# Patient Record
Sex: Female | Born: 1960 | Hispanic: Yes | Marital: Married | Smoking: Never smoker
Health system: Southern US, Community
[De-identification: ages and names within clinical notes are randomized; demographics above are authoritative.]

## PROBLEM LIST (undated history)

## (undated) DIAGNOSIS — E785 Hyperlipidemia, unspecified: Secondary | ICD-10-CM

## (undated) HISTORY — PX: CHOLECYSTECTOMY: SHX55

## (undated) HISTORY — DX: Hyperlipidemia, unspecified: E78.5

## (undated) HISTORY — PX: ABDOMINAL HYSTERECTOMY: SHX81

---

## 2001-12-02 ENCOUNTER — Encounter: Payer: Self-pay | Admitting: Emergency Medicine

## 2001-12-02 ENCOUNTER — Emergency Department (HOSPITAL_COMMUNITY): Admission: EM | Admit: 2001-12-02 | Discharge: 2001-12-02 | Payer: Self-pay | Admitting: Emergency Medicine

## 2001-12-03 ENCOUNTER — Ambulatory Visit (HOSPITAL_COMMUNITY): Admission: RE | Admit: 2001-12-03 | Discharge: 2001-12-03 | Payer: Self-pay | Admitting: Emergency Medicine

## 2001-12-03 ENCOUNTER — Emergency Department (HOSPITAL_COMMUNITY): Admission: EM | Admit: 2001-12-03 | Discharge: 2001-12-04 | Payer: Self-pay

## 2001-12-03 ENCOUNTER — Encounter: Payer: Self-pay | Admitting: Emergency Medicine

## 2001-12-05 ENCOUNTER — Encounter (HOSPITAL_COMMUNITY): Admission: RE | Admit: 2001-12-05 | Discharge: 2002-01-04 | Payer: Self-pay | Admitting: General Surgery

## 2001-12-05 ENCOUNTER — Encounter: Payer: Self-pay | Admitting: General Surgery

## 2001-12-06 ENCOUNTER — Inpatient Hospital Stay (HOSPITAL_COMMUNITY): Admission: AD | Admit: 2001-12-06 | Discharge: 2001-12-08 | Payer: Self-pay | Admitting: General Surgery

## 2002-10-31 ENCOUNTER — Inpatient Hospital Stay (HOSPITAL_COMMUNITY): Admission: RE | Admit: 2002-10-31 | Discharge: 2002-11-04 | Payer: Self-pay | Admitting: *Deleted

## 2002-11-28 ENCOUNTER — Ambulatory Visit (HOSPITAL_COMMUNITY): Admission: RE | Admit: 2002-11-28 | Discharge: 2002-11-28 | Payer: Self-pay | Admitting: General Surgery

## 2002-11-28 ENCOUNTER — Encounter: Payer: Self-pay | Admitting: General Surgery

## 2004-01-14 ENCOUNTER — Emergency Department (HOSPITAL_COMMUNITY): Admission: EM | Admit: 2004-01-14 | Discharge: 2004-01-15 | Payer: Self-pay | Admitting: Emergency Medicine

## 2011-12-15 ENCOUNTER — Encounter (HOSPITAL_COMMUNITY): Payer: Self-pay | Admitting: Emergency Medicine

## 2011-12-15 ENCOUNTER — Emergency Department (HOSPITAL_COMMUNITY): Payer: Self-pay

## 2011-12-15 ENCOUNTER — Emergency Department (HOSPITAL_COMMUNITY)
Admission: EM | Admit: 2011-12-15 | Discharge: 2011-12-15 | Disposition: A | Discharge status: Home / Self Care | Payer: Self-pay | Attending: Emergency Medicine | Admitting: Emergency Medicine

## 2011-12-15 DIAGNOSIS — K5289 Other specified noninfective gastroenteritis and colitis: Secondary | ICD-10-CM | POA: Insufficient documentation

## 2011-12-15 DIAGNOSIS — R109 Unspecified abdominal pain: Secondary | ICD-10-CM

## 2011-12-15 DIAGNOSIS — R1013 Epigastric pain: Secondary | ICD-10-CM | POA: Insufficient documentation

## 2011-12-15 DIAGNOSIS — K529 Noninfective gastroenteritis and colitis, unspecified: Secondary | ICD-10-CM

## 2011-12-15 LAB — COMPREHENSIVE METABOLIC PANEL
ALT: 39 U/L — ABNORMAL HIGH (ref 0–35)
Alkaline Phosphatase: 137 U/L — ABNORMAL HIGH (ref 39–117)
BUN: 9 mg/dL (ref 6–23)
CO2: 30 mEq/L (ref 19–32)
Calcium: 10.1 mg/dL (ref 8.4–10.5)
GFR calc Af Amer: >90 mL/min (ref >90)
GFR calc non Af Amer: >90 mL/min (ref >90)
Glucose, Bld: 92 mg/dL (ref 70–99)
Potassium: 3.4 mEq/L — ABNORMAL LOW (ref 3.5–5.1)
Total Protein: 8.4 g/dL — ABNORMAL HIGH (ref 6.0–8.3)

## 2011-12-15 LAB — URINALYSIS, ROUTINE W REFLEX MICROSCOPIC
Bilirubin Urine: NEGATIVE
Ketones, ur: NEGATIVE mg/dL
Nitrite: NEGATIVE
Urobilinogen, UA: 0.2 mg/dL (ref 0.0–1.0)
pH: 7 (ref 5.0–8.0)

## 2011-12-15 LAB — CBC WITH DIFFERENTIAL/PLATELET
Basophils Relative: 0 % (ref 0–1)
Eosinophils Absolute: 0.1 10*3/uL (ref 0.0–0.7)
HCT: 42.6 % (ref 36.0–46.0)
Hemoglobin: 15 g/dL (ref 12.0–15.0)
Lymphs Abs: 1.4 10*3/uL (ref 0.7–4.0)
MCH: 30.7 pg (ref 26.0–34.0)
MCHC: 35.2 g/dL (ref 30.0–36.0)
MCV: 87.3 fL (ref 78.0–100.0)
Monocytes Absolute: 0.3 10*3/uL (ref 0.1–1.0)
Monocytes Relative: 6 % (ref 3–12)
RBC: 4.88 MIL/uL (ref 3.87–5.11)

## 2011-12-15 MED ORDER — ONDANSETRON HCL 4 MG/2ML IJ SOLN
4.0000 mg | Freq: Once | INTRAMUSCULAR | Status: AC
  Administered 2011-12-15: 4 mg via INTRAVENOUS
  Filled 2011-12-15: qty 2

## 2011-12-15 MED ORDER — HYDROCODONE-ACETAMINOPHEN 5-325 MG PO TABS: 1.0000 | ORAL_TABLET | Freq: Four times a day (QID) | ORAL | Status: DC | PRN

## 2011-12-15 MED ORDER — PROMETHAZINE HCL 25 MG PO TABS: 25.0000 mg | ORAL_TABLET | Freq: Four times a day (QID) | ORAL | Status: DC | PRN

## 2011-12-15 MED ORDER — IOHEXOL 300 MG/ML  SOLN
100.0000 mL | Freq: Once | INTRAMUSCULAR | Status: AC | PRN
  Administered 2011-12-15: 100 mL via INTRAVENOUS

## 2011-12-15 MED ORDER — HYDROMORPHONE HCL PF 1 MG/ML IJ SOLN
0.5000 mg | Freq: Once | INTRAMUSCULAR | Status: AC
  Administered 2011-12-15: 0.5 mg via INTRAVENOUS
  Filled 2011-12-15: qty 1

## 2011-12-15 NOTE — ED Provider Notes (Signed)
History     CSN: [id number]  Arrival date & time 12/15/11  4098   First MD Initiated Contact with Patient 12/15/11 [phone]      Chief Complaint  Patient presents with  . Abdominal Pain    (Consider location/radiation/quality/duration/timing/severity/associated sxs/prior treatment) Patient is a 51 y.o. female presenting with abdominal pain. The history is provided by the patient (the pt complains of abd pain). No language interpreter was used.  Abdominal Pain The primary symptoms of the illness include abdominal pain. The primary symptoms of the illness do not include fatigue or diarrhea. The current episode started more than 2 days ago. The problem has not changed since onset. Associated with: nothing. The patient states that she believes she is currently not pregnant. The patient has not had a change in bowel habit. Symptoms associated with the illness do not include chills, hematuria, frequency or back pain. Significant associated medical issues include PUD.    History reviewed. No pertinent past medical history.  Past Surgical History  Procedure Date  . Cholecystectomy   . Cesarean section     x2    History reviewed. No pertinent family history.  History  Substance Use Topics  . Smoking status: Never Smoker   . Smokeless tobacco: Not on file  . Alcohol Use: No    OB History    Grav Para Term Preterm Abortions TAB SAB Ect Mult Living                  Review of Systems  Constitutional: Negative for chills and fatigue.  HENT: Negative for congestion, sinus pressure and ear discharge.   Eyes: Negative for discharge.  Respiratory: Negative for cough.   Cardiovascular: Negative for chest pain.  Gastrointestinal: Positive for abdominal pain. Negative for diarrhea.  Genitourinary: Negative for frequency and hematuria.  Musculoskeletal: Negative for back pain.  Skin: Negative for rash.  Neurological: Negative for seizures and headaches.  Hematological: Negative.     Psychiatric/Behavioral: Negative for hallucinations.    Allergies  Review of patient's allergies indicates no known allergies.  Home Medications   Current Outpatient Rx  Name Route Sig Dispense Refill  . CALCIUM & MAGNESIUM CARBONATES 311-232 MG PO TABS Oral Take 1 tablet by mouth daily.    Marland Kitchen HYDROCODONE-ACETAMINOPHEN 5-325 MG PO TABS Oral Take 1 tablet by mouth every 6 (six) hours as needed for pain. 20 tablet 0  . PROMETHAZINE HCL 25 MG PO TABS Oral Take 1 tablet (25 mg total) by mouth every 6 (six) hours as needed for nausea. 15 tablet 0    BP 122/62  Pulse 70  Temp 99.4 F (37.4 C) (Oral)  Resp 18  Ht 5\' 1"  (1.549 m)  Wt 165 lb (74.844 kg)  BMI 31.18 kg/m2  SpO2 96%  Physical Exam  Constitutional: She is oriented to person, place, and time. She appears well-developed.  HENT:  Head: Normocephalic and atraumatic.  Eyes: Conjunctivae normal and EOM are normal. No scleral icterus.  Neck: Neck supple. No thyromegaly present.  Cardiovascular: Normal rate and regular rhythm.  Exam reveals no gallop and no friction rub.   No murmur heard. Pulmonary/Chest: No stridor. She has no wheezes. She has no rales. She exhibits no tenderness.  Abdominal: She exhibits no distension. There is tenderness. There is no rebound.       Mild tender epigastric  Musculoskeletal: Normal range of motion. She exhibits no edema.  Lymphadenopathy:    She has no cervical adenopathy.  Neurological: She is oriented  to person, place, and time. Coordination normal.  Skin: No rash noted. No erythema.  Psychiatric: She has a normal mood and affect. Her behavior is normal.    ED Course  Procedures (including critical care time)  Labs Reviewed  COMPREHENSIVE METABOLIC PANEL - Abnormal; Notable for the following:    Potassium 3.4 (*)     Total Protein 8.4 (*)     ALT 39 (*)     Alkaline Phosphatase 137 (*)     All other components within normal limits  URINALYSIS, ROUTINE W REFLEX MICROSCOPIC -  Abnormal; Notable for the following:    Specific Gravity, Urine <1.005 (*)     Leukocytes, UA TRACE (*)     All other components within normal limits  CBC WITH DIFFERENTIAL  LIPASE, BLOOD  URINE MICROSCOPIC-ADD ON   Ct Abdomen Pelvis W Contrast  12/15/2011  *RADIOLOGY REPORT*  Clinical Data: Abdominal pain and burning for 1 month. Cholecystectomy and hysterectomy.  CT ABDOMEN AND PELVIS WITH CONTRAST  Technique:  Multidetector CT imaging of the abdomen and pelvis was performed following the standard protocol during bolus administration of intravenous contrast.  Contrast: OMNIPAQUE IOHEXOL 300 MG/ML  SOLN  Comparison: 01/15/2004 CT abdomen.  Findings: Lung Bases: Clear.  Liver:  Normal.  Spleen:  Normal.  Gallbladder:  Surgically absent.  Common bile duct:  Normal.  Pancreas:  Normal.  Adrenal glands:  Normal.  Kidneys:  Normal enhancement.  Normal delayed excretion.  Ureters appear normal.  Stomach:  Normal.  Small bowel:  Mild mural thickening is present in proximal small bowel, likely in the jejunum.  This is in the left upper quadrant and left central abdomen (image number 50 series 2).  There are no adjacent inflammatory changes or small bowel adenopathy.  Colon:   Normal appendix.  No inflammatory changes or mural thickening.  Pelvic Genitourinary:  No free fluid.  Hysterectomy.  Urinary bladder normal.  Midline abdominal scar, presumably for hysterectomy.  Bones:  No aggressive osseous lesions.  Vasculature: Normal.  IMPRESSION: 1.  Mild mural thickening of proximal small bowel compatible with enteritis, likely infectious but nonspecific. 2.  Cholecystectomy and hysterectomy.   Original Report Authenticated By: Andreas Newport, M.D.      1. Abdominal  pain, other specified site   2. Enteritis       MDM          Benny Lennert, MD 12/15/11 [phone]

## 2011-12-15 NOTE — ED Notes (Signed)
Patient drinking CT contrast at this time.

## 2011-12-15 NOTE — ED Notes (Signed)
Pt states abd pain that has been ongoing for almost a month. States she has been seen at urgent care when it first started and given nexium and mylanta. States the pain is a burning pain across the bottom of the stomach, also notes nausea accompanies the pain. Abd soft and nontender.

## 2011-12-15 NOTE — ED Notes (Signed)
Patient back from radiology.

## 2011-12-19 ENCOUNTER — Encounter: Payer: Self-pay | Admitting: Gastroenterology

## 2011-12-19 ENCOUNTER — Ambulatory Visit (INDEPENDENT_AMBULATORY_CARE_PROVIDER_SITE_OTHER): Payer: BC Managed Care – PPO | Admitting: Gastroenterology

## 2011-12-19 ENCOUNTER — Encounter (HOSPITAL_COMMUNITY): Payer: Self-pay | Admitting: Pharmacy Technician

## 2011-12-19 VITALS — BP 97/71 | HR 59 | Temp 98.6°F | Ht 61.0 in | Wt 167.6 lb

## 2011-12-19 DIAGNOSIS — K3189 Other diseases of stomach and duodenum: Secondary | ICD-10-CM

## 2011-12-19 DIAGNOSIS — R1013 Epigastric pain: Secondary | ICD-10-CM | POA: Insufficient documentation

## 2011-12-19 DIAGNOSIS — R195 Other fecal abnormalities: Secondary | ICD-10-CM

## 2011-12-19 MED ORDER — SODIUM CHLORIDE 0.45 % IV SOLN
INTRAVENOUS | Status: DC
  Administered 2011-12-20: 13:00:00 via INTRAVENOUS

## 2011-12-19 MED ORDER — OMEPRAZOLE 20 MG PO CPDR: 20.0000 mg | DELAYED_RELEASE_CAPSULE | Freq: Every day | ORAL | Status: DC

## 2011-12-19 NOTE — Patient Instructions (Addendum)
You have been set up for an upper endoscopy with Dr. Darrick Penna tomorrow.   Follow a bland diet, clear liquids for now.   I have provided samples of Prilosec to start taking once a day.   Please complete stool studies as well.

## 2011-12-19 NOTE — Assessment & Plan Note (Signed)
51 year old female with new onset dyspepsia X 1 month. Associated with eating, nausea. No melena, rectal bleeding. +loss of appetite, + wt loss of a few lbs. No NSAIDs. Chronic hx of bowel habits alternating between hard stools and loose. Notes 2 loose stools per day at most. "sometimes hard". CT notes possible enteritis from a week ago. However, despite dietary modification (bland diet), nausea medication, pain medication, abdominal pain continues and has been steady X 1 month. Will order stool studies as well to be complete and proceed with an upper endoscopy for further evaluation.  Proceed with upper endoscopy in the near future with Dr. Darrick Penna. The risks, benefits, and alternatives have been discussed in detail with patient. They have stated understanding and desire to proceed.

## 2011-12-19 NOTE — Progress Notes (Signed)
No PCP on file 

## 2011-12-19 NOTE — Progress Notes (Signed)
Referring Provider: [facility] ED Primary Care Physician:  None Primary Gastroenterologist:  Dr. Darrick Penna   Chief Complaint  Patient presents with  . Abdominal Pain  . Constipation  . Diarrhea    HPI:   Pleasant 51 year old female presenting at request of [facility] ED secondary to abdominal pain. CT Oct 10th showing mild mural thickening in proximal small bowel (likely jejunum), compatible with enteritis, likely infectious but non-specific. No other abnormalities. CBC normal, lipase normal, ALT very mildly elevated at 39, AP 137.  Notes month long hx of upper abdominal burning only with eating. +nausea with eating. No vomiting. Drinking lots of ginger ale. Has decreased po intake due to pain with eating. Lost several pounds. +early satiety. Denies NSAIDs or aspirin powders. Feels very full. +reflux, no dysphagia.  Prior to current symptoms, notes alternating loose stool and constipation. Now feels there is more loose stool than her "normal". At most twice per day, usually just once. Notes after eating. Denies bright red blood, melena.   Eating fish, vegetables, feels better when eating vegetables. Sometimes doesn't know what to eat.   Past Medical History  Diagnosis Date  . Hypertension     in past, resolved    Past Surgical History  Procedure Date  . Cholecystectomy   . Cesarean section     x2  . Abdominal hysterectomy     Current Outpatient Prescriptions  Medication Sig Dispense Refill  . HYDROcodone-acetaminophen (NORCO/VICODIN) 5-325 MG per tablet Take 1 tablet by mouth every 6 (six) hours as needed for pain.  20 tablet  0  . promethazine (PHENERGAN) 25 MG tablet Take 1 tablet (25 mg total) by mouth every 6 (six) hours as needed for nausea.  15 tablet  0  . calcium & magnesium carbonates (MYLANTA) 311-232 MG per tablet Take 1 tablet by mouth daily.      Marland Kitchen omeprazole (PRILOSEC) 20 MG capsule Take 1 capsule (20 mg total) by mouth daily. 30 minutes before breakfast.  30 capsule  1     Allergies as of 12/19/2011  . (No Known Allergies)    Family History  Problem Relation Age of Onset  . Liver cancer Mother     ?states exposed to someone with hepatitis, ?cirrhosis  . Colon cancer Neg Hx     History   Social History  . Marital Status: Married    Spouse Name: N/A    Number of Children: N/A  . Years of Education: N/A   Occupational History  . Not on file.   Social History Main Topics  . Smoking status: Never Smoker   . Smokeless tobacco: Not on file  . Alcohol Use: No  . Drug Use: No  . Sexually Active: Not on file   Other Topics Concern  . Not on file   Social History Narrative  . No narrative on file    Review of Systems: Gen: SEE HPI CV: Denies chest pain, heart palpitations, syncope, peripheral edema. Resp: Denies shortness of breath with rest, cough, wheezing GI: Denies dysphagia or odynophagia. Denies hematemesis, fecal incontinence, or jaundice.  GU : Denies urinary burning, urinary frequency, urinary incontinence.  MS: Denies joint pain, muscle weakness, cramps, limited movement Derm: Denies rash, itching, dry skin Psych: Denies depression, anxiety, confusion or memory loss  Heme: Denies bruising, bleeding, and enlarged lymph nodes.  Physical Exam: BP 97/71  Pulse 59  Temp 98.6 F (37 C) (Temporal)  Ht 5\' 1"  (1.549 m)  Wt 167 lb 9.6 oz (76.023 kg)  BMI 31.67 kg/m2 General:   Alert and oriented. Well-developed, well-nourished, pleasant and cooperative. Head:  Normocephalic and atraumatic. Eyes:  Conjunctiva pink, sclera clear, no icterus.    Ears:  Normal auditory acuity. Nose:  No deformity, discharge,  or lesions. Mouth:  No deformity or lesions, mucosa pink and moist.  Neck:  Supple, without mass or thyromegaly. Lungs:  Clear to auscultation bilaterally, without wheezing, rales, or rhonchi.  Heart:  S1, S2 present without murmurs noted.  Abdomen:  +BS, soft, mildly TTP RUQ/upper abdomen/LUQ and non-distended. Without mass  or HSM. No rebound or guarding. No hernias noted. Rectal:  Deferred  Msk:  Symmetrical without gross deformities. Normal posture. Extremities:  Without clubbing or edema. Neurologic:  Alert and  oriented x4;  grossly normal neurologically. Skin:  Intact, warm and dry without significant lesions or rashes Cervical Nodes:  No significant cervical adenopathy. Psych:  Alert and cooperative. Normal mood and affect.

## 2011-12-19 NOTE — Assessment & Plan Note (Signed)
Appears chronic, alternates with "hard stools". Pt subjectively notes more loose stools than her "normal". Stool studies ordered. EGD as planned for dyspepsia. No need for colonoscopy until age 51 unless indicated.

## 2011-12-20 ENCOUNTER — Encounter (HOSPITAL_COMMUNITY): Payer: Self-pay

## 2011-12-20 ENCOUNTER — Ambulatory Visit (HOSPITAL_COMMUNITY)
Admission: RE | Admit: 2011-12-20 | Discharge: 2011-12-20 | Disposition: A | Discharge status: Home / Self Care | Payer: BC Managed Care – PPO | Source: Ambulatory Visit | Attending: Gastroenterology | Admitting: Gastroenterology

## 2011-12-20 ENCOUNTER — Encounter (HOSPITAL_COMMUNITY)
Admission: RE | Disposition: A | Discharge status: Home / Self Care | Payer: BC Managed Care – PPO | Source: Ambulatory Visit | Attending: Gastroenterology

## 2011-12-20 DIAGNOSIS — K2940 Chronic atrophic gastritis without bleeding: Secondary | ICD-10-CM | POA: Insufficient documentation

## 2011-12-20 DIAGNOSIS — I10 Essential (primary) hypertension: Secondary | ICD-10-CM | POA: Insufficient documentation

## 2011-12-20 DIAGNOSIS — K2990 Gastroduodenitis, unspecified, without bleeding: Secondary | ICD-10-CM

## 2011-12-20 DIAGNOSIS — R1013 Epigastric pain: Secondary | ICD-10-CM | POA: Insufficient documentation

## 2011-12-20 DIAGNOSIS — A048 Other specified bacterial intestinal infections: Secondary | ICD-10-CM | POA: Insufficient documentation

## 2011-12-20 DIAGNOSIS — R110 Nausea: Secondary | ICD-10-CM | POA: Insufficient documentation

## 2011-12-20 DIAGNOSIS — K2970 Gastritis, unspecified, without bleeding: Secondary | ICD-10-CM

## 2011-12-20 HISTORY — PX: ESOPHAGOGASTRODUODENOSCOPY: SHX5428

## 2011-12-20 SURGERY — EGD (ESOPHAGOGASTRODUODENOSCOPY)
Anesthesia: Moderate Sedation

## 2011-12-20 MED ORDER — OMEPRAZOLE 20 MG PO CPDR: DELAYED_RELEASE_CAPSULE | ORAL | Status: DC

## 2011-12-20 MED ORDER — MEPERIDINE HCL 100 MG/ML IJ SOLN
INTRAMUSCULAR | Status: DC | PRN
  Administered 2011-12-20: 50 mg via INTRAVENOUS
  Administered 2011-12-20: 25 mg via INTRAVENOUS

## 2011-12-20 MED ORDER — SIMETHICONE 40 MG/0.6ML PO SUSP
ORAL | Status: DC | PRN
  Administered 2011-12-20: 13:00:00

## 2011-12-20 MED ORDER — MIDAZOLAM HCL 5 MG/5ML IJ SOLN
INTRAMUSCULAR | Status: AC
  Filled 2011-12-20: qty 10

## 2011-12-20 MED ORDER — MIDAZOLAM HCL 5 MG/5ML IJ SOLN
INTRAMUSCULAR | Status: DC | PRN
  Administered 2011-12-20 (×2): 2 mg via INTRAVENOUS
  Administered 2011-12-20: 1 mg via INTRAVENOUS

## 2011-12-20 MED ORDER — MEPERIDINE HCL 100 MG/ML IJ SOLN
INTRAMUSCULAR | Status: AC
  Filled 2011-12-20: qty 2

## 2011-12-20 MED ORDER — BUTAMBEN-TETRACAINE-BENZOCAINE 2-2-14 % EX AERO
INHALATION_SPRAY | CUTANEOUS | Status: DC | PRN
  Administered 2011-12-20: 2 via TOPICAL

## 2011-12-20 NOTE — H&P (Signed)
  Primary Care Physician:  Jonette Eva, MD Primary Gastroenterologist:  Dr. Darrick Penna  Pre-Procedure History & Physical: HPI:  Catherine Bass is a 51 y.o. female here for ABDOMINAL PAIN.  Past Medical History  Diagnosis Date  . Hypertension     in past, resolved    Past Surgical History  Procedure Date  . Cholecystectomy   . Cesarean section     x2  . Abdominal hysterectomy     Prior to Admission medications   Medication Sig Start Date End Date Taking? Authorizing Provider  HYDROcodone-acetaminophen (NORCO/VICODIN) 5-325 MG per tablet Take 1 tablet by mouth every 6 (six) hours as needed for pain. 12/15/11  Yes Benny Lennert, MD  promethazine (PHENERGAN) 25 MG tablet Take 1 tablet (25 mg total) by mouth every 6 (six) hours as needed for nausea. 12/15/11  Yes Benny Lennert, MD    Allergies as of 12/19/2011  . (No Known Allergies)    Family History  Problem Relation Age of Onset  . Liver cancer Mother     ?states exposed to someone with hepatitis, ?cirrhosis  . Colon cancer Neg Hx     History   Social History  . Marital Status: Married    Spouse Name: N/A    Number of Children: N/A  . Years of Education: N/A   Occupational History  . Not on file.   Social History Main Topics  . Smoking status: Never Smoker   . Smokeless tobacco: Not on file  . Alcohol Use: No  . Drug Use: No  . Sexually Active: Not on file   Other Topics Concern  . Not on file   Social History Narrative  . No narrative on file    Review of Systems: See HPI, otherwise negative ROS   Physical Exam: BP 112/64  Pulse 68  Temp 97.6 F (36.4 C) (Oral)  Resp 17  SpO2 98% General:   Alert,  pleasant and cooperative in NAD Head:  Normocephalic and atraumatic. Neck:  Supple; Lungs:  Clear throughout to auscultation.    Heart:  Regular rate and rhythm. Abdomen:  Soft, nontender and nondistended. Normal bowel sounds, without guarding, and without rebound.   Neurologic:  Alert and   oriented x4;  grossly normal neurologically.  Impression/Plan:     ABDOMINAL PAIN  PLAN:  EGD TODAY

## 2011-12-20 NOTE — Op Note (Signed)
[facility] [address] [place], [address]   ENDOSCOPY PROCEDURE REPORT  PATIENT: Catherine, Bass  MR#: [id number] BIRTHDATE: 1960-04-14 , 50  yrs. old GENDER: Female  ENDOSCOPIST: Jonette Eva, MD REFERRED BY:   NONE  PROCEDURE DATE: 12/20/2011 PROCEDURE:   EGD w/ biopsy  INDICATIONS:epigastric pain.   nausea. MEDICATIONS: Demerol 75 mg IV and Versed 5 mg IV TOPICAL ANESTHETIC:   Cetacaine Spray  DESCRIPTION OF PROCEDURE:     Physical exam was performed.  Informed consent was obtained from the patient after explaining the benefits, risks, and alternatives to the procedure.  The patient was connected to the monitor and placed in the left lateral position.  Continuous oxygen was provided by nasal cannula and IV medicine administered through an indwelling cannula.  After administration of sedation, the patients esophagus was intubated and the Pentax Gastroscope [id number]  endoscope was advanced under direct visualization to the second portion of the duodenum.  The scope was removed slowly by carefully examining the color, texture, anatomy, and integrity of the mucosa on the way out.  The patient was recovered in endoscopy and discharged home in satisfactory condition.      ESOPHAGUS: The mucosa of the esophagus appeared normal.  STOMACH: Moderate nodular gastritis (inflammation) was found in the entire examined stomach.  Multiple biopsies were performed using cold forceps.  DUODENUM: The duodenal mucosa showed no abnormalities in the 2nd part of the duodenum and ampulla. COMPLICATIONS:   None  ENDOSCOPIC IMPRESSION: 1.   The mucosa of the esophagus appeared normal 2.   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   The duodenal mucosa showed no abnormalities in the 2nd part of the duodenum and ampulla  RECOMMENDATIONS: 1.  BID PPI LOW FAT DIET AWAIT BIOPSY OPV IN 4 MOS 2.  BID PPI LOW FAT DIET AWAIT BIOPSY OPV  IN 4 MOS   REPEAT EXAM:   _______________________________ Rosalie DoctorJonette Eva, MD 12/20/2011 2:12 PM       PATIENT NAME:  Catherine, Bass MR#: [id number]

## 2011-12-21 LAB — CLOSTRIDIUM DIFFICILE BY PCR: Toxigenic C. Difficile by PCR: NOT DETECTED

## 2011-12-21 LAB — FECAL LACTOFERRIN, QUANT: Lactoferrin: NEGATIVE

## 2011-12-21 LAB — GIARDIA ANTIGEN: Giardia Screen (EIA): NEGATIVE

## 2011-12-22 ENCOUNTER — Telehealth: Payer: Self-pay | Admitting: Gastroenterology

## 2011-12-22 MED ORDER — CLARITHROMYCIN 500 MG PO TABS: ORAL_TABLET | ORAL | Status: DC

## 2011-12-22 MED ORDER — AMOXICILLIN 500 MG PO CAPS: ORAL_CAPSULE | ORAL | Status: DC

## 2011-12-22 NOTE — Telephone Encounter (Signed)
No PCP on file, recall made  

## 2011-12-22 NOTE — Telephone Encounter (Signed)
PLEASE CALL PT. Her stomach Bx showed H. Pylori infection. She needs AMOXICILLIN 500 mg 2 po BID for 10 days and Biaxin 500 mg po bid for 10 days. RX SENT. She needs TO CONTINUE Omeprazole 20 mg BID for 3 mos THEN ONCE DAILY FOR ONE YEAR. Med side effects include NVD, abd pain, and metallic taste.  FOLLOW A LOW FAT DIET.  OPV IN 4 MOS E30 DYSPEPSIA

## 2011-12-22 NOTE — Telephone Encounter (Signed)
Tried to call with no answer  

## 2011-12-23 ENCOUNTER — Encounter (HOSPITAL_COMMUNITY): Payer: Self-pay | Admitting: Gastroenterology

## 2011-12-23 NOTE — Telephone Encounter (Signed)
Pt's son is aware of results and that Rx was at the drug store for her.  Dawn can you please NIC for 4 month follow up

## 2011-12-23 NOTE — Telephone Encounter (Signed)
Tried to call with no answer  

## 2011-12-23 NOTE — Telephone Encounter (Signed)
Recall made 

## 2012-01-03 NOTE — Progress Notes (Signed)
Quick Note:  Called. Many rings and no answer. ______ 

## 2012-01-03 NOTE — Progress Notes (Signed)
Quick Note:    Stool studies normal.  ______

## 2012-01-04 NOTE — Progress Notes (Signed)
Quick Note:  Letter mailed to pt of normal stool studies. ______

## 2012-01-18 NOTE — Progress Notes (Signed)
EGD OCT 2013 H PYLORI   REVIEWED.

## 2012-03-06 ENCOUNTER — Encounter: Payer: Self-pay | Admitting: *Deleted

## 2014-05-28 IMAGING — CT CT ABD-PELV W/ CM
2 of 4 series · 16 of 46 positions shown, 18 images · IV contrast (Omnipaque 300)
Comparison: 01/15/2004 CT abdomen.

CLINICAL DATA: Abdominal pain and burning for 1 month.
Cholecystectomy and hysterectomy.

CT ABDOMEN AND PELVIS WITH CONTRAST
TECHNIQUE: Multidetector CT imaging of the abdomen and pelvis was
performed following the standard protocol during bolus
administration of intravenous contrast.
Contrast: 100mL OMNIPAQUE IOHEXOL 300 MG/ML  SOLN

[Series 2: abd_pel_with 5.0 b40f · axial · 0.82mm/px · z∈[-478,-42]mm · 13 of 97 slices shown, 15 images]
[im 5/97  soft-tissue]
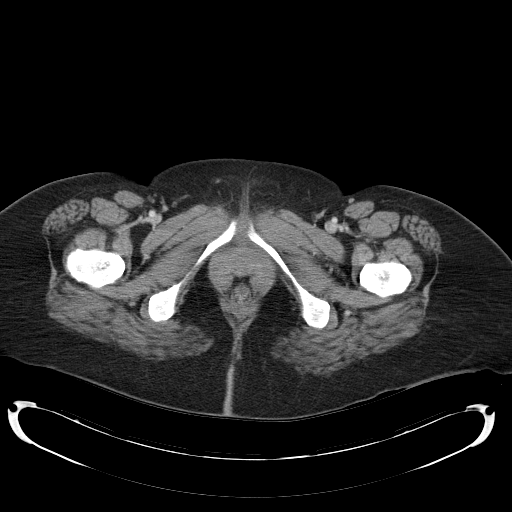
[im 5/97  bone]
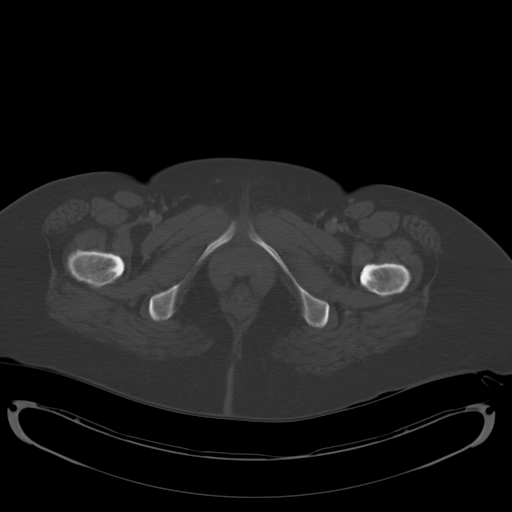
[im 14/97  soft-tissue]
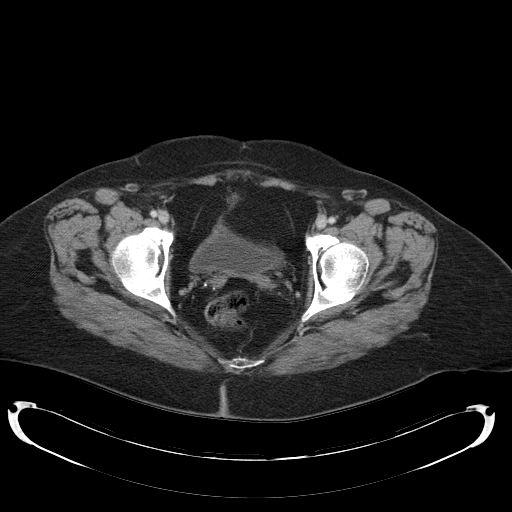
[im 19/97  soft-tissue]
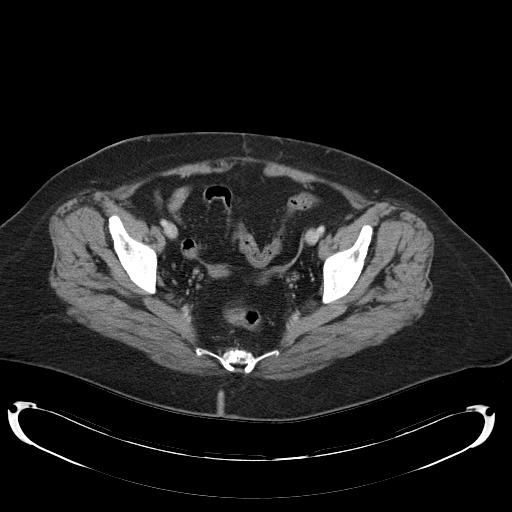
[im 28/97  soft-tissue]
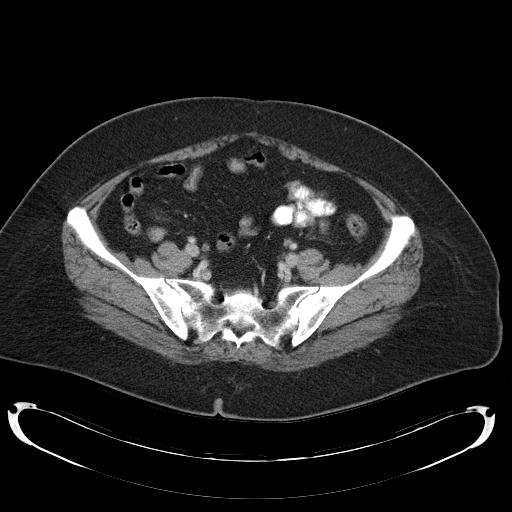
[im 33/97  soft-tissue]
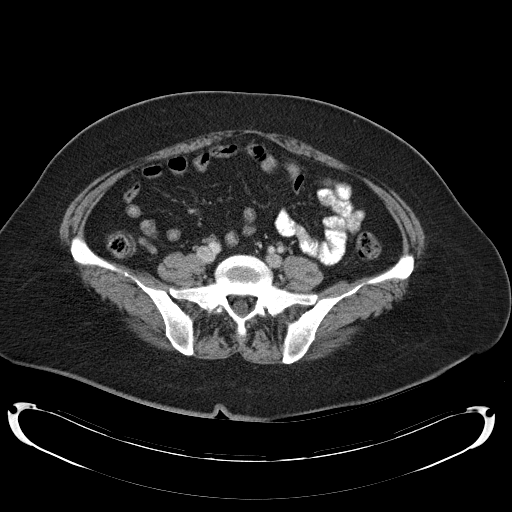
[im 42/97  soft-tissue]
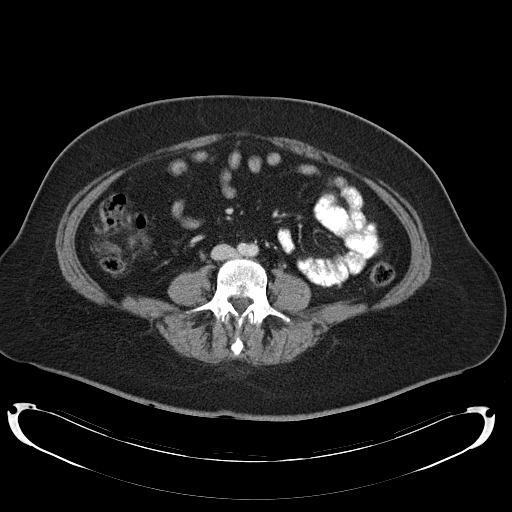
[im 51/97  soft-tissue]
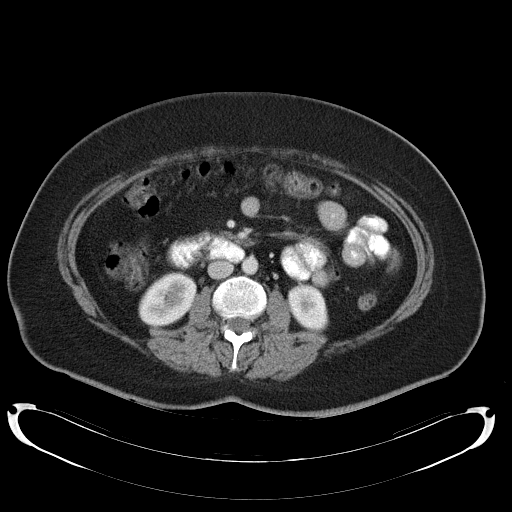
[im 55/97  soft-tissue]
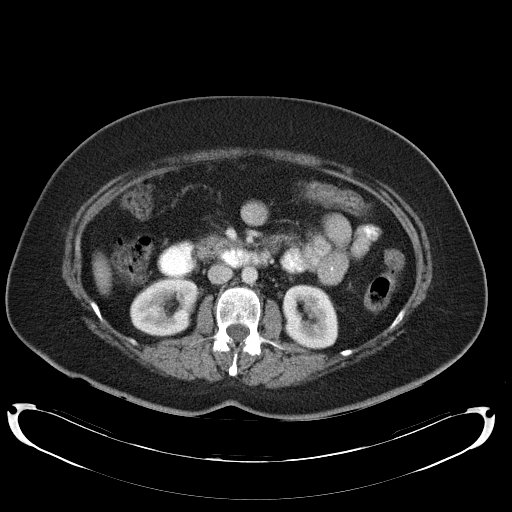
[im 65/97  soft-tissue]
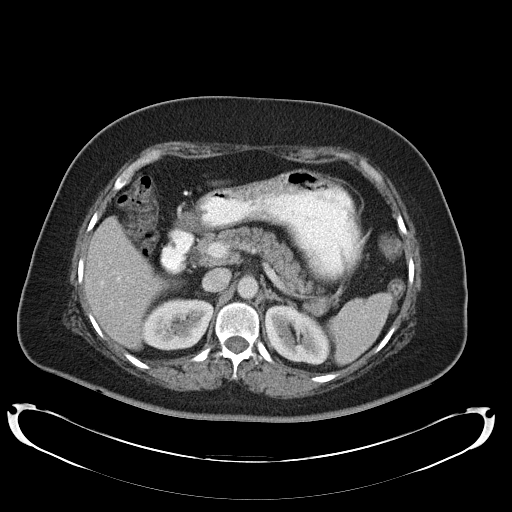
[im 65/97  bone]
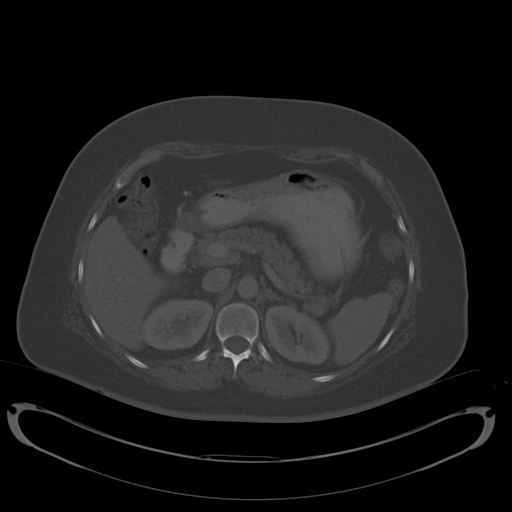
[im 69/97  soft-tissue]
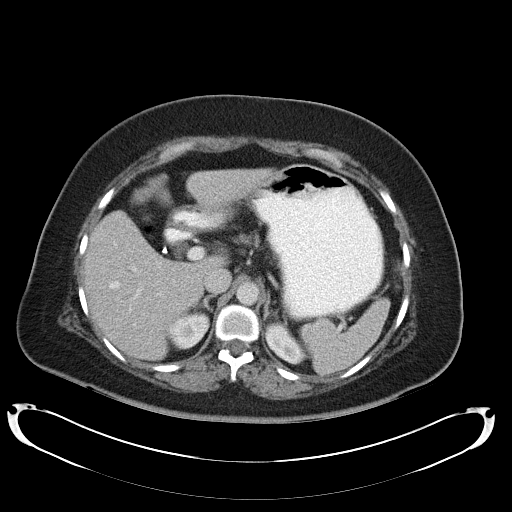
[im 78/97  soft-tissue]
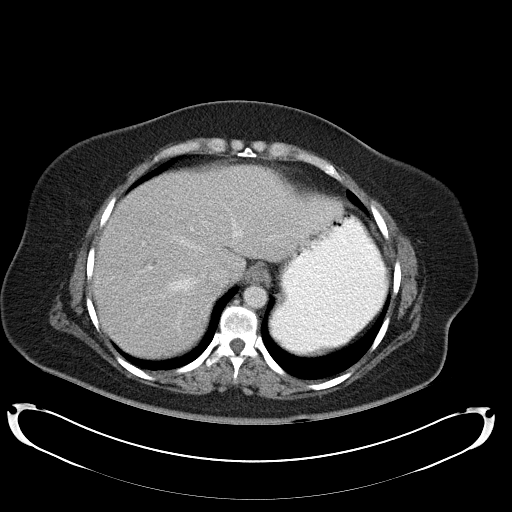
[im 83/97  soft-tissue]
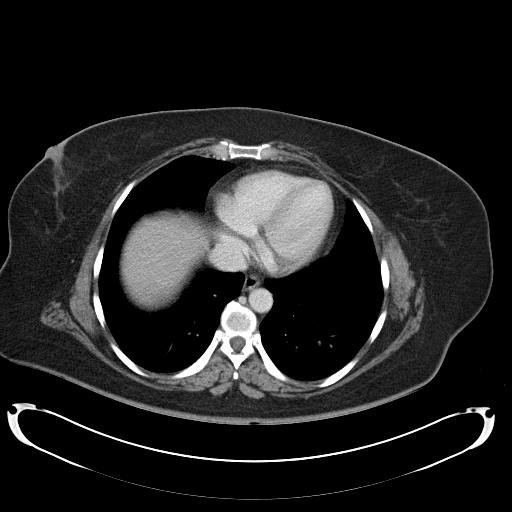
[im 92/97  soft-tissue]
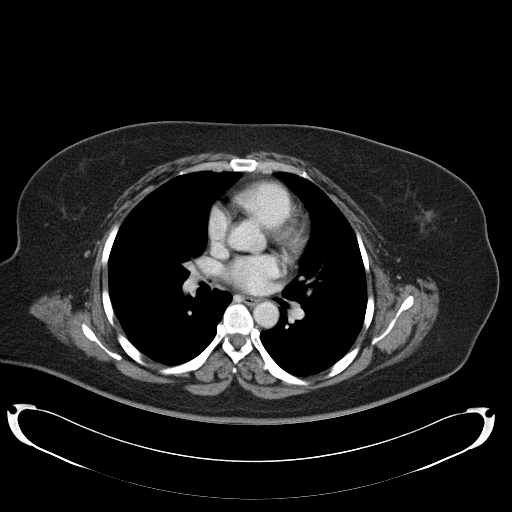

[Series 4: abd_pel_with 3.0 spo cor · coronal · 0.74mm/px · 3 of 83 slices shown]
[im 28/83  soft-tissue]
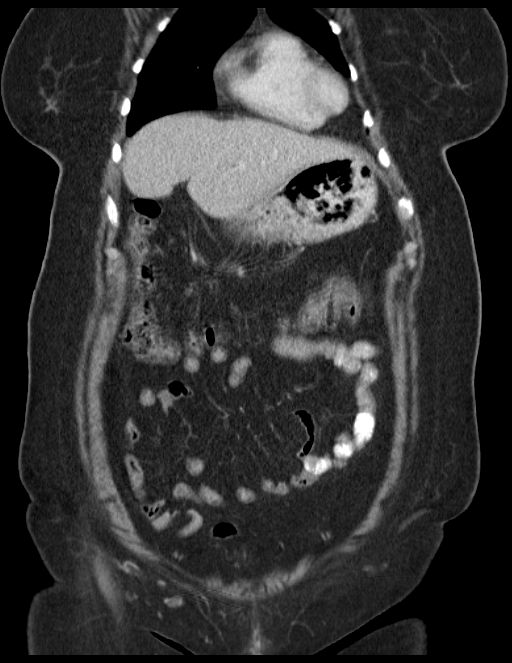
[im 37/83  soft-tissue]
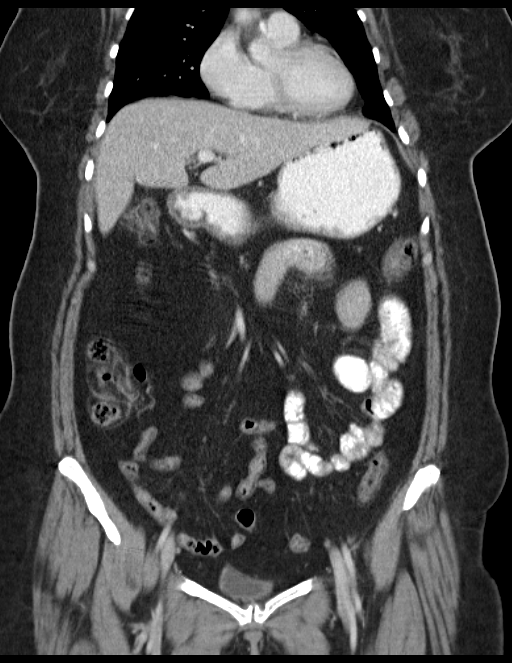
[im 46/83  soft-tissue]
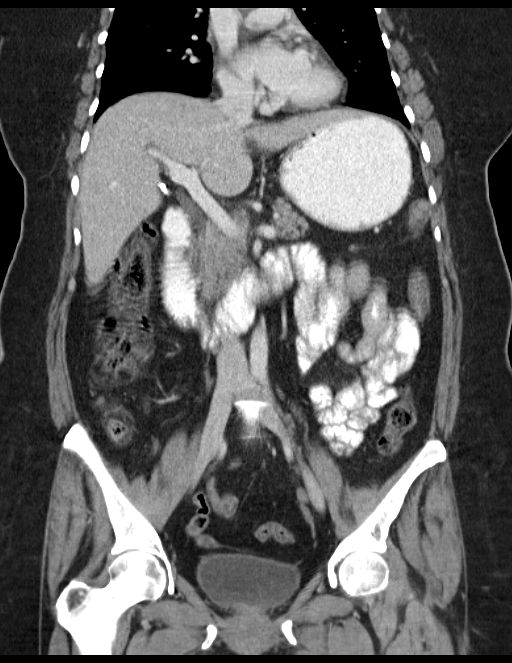

[16 of 46 positions shown; findings below may reference images not displayed]

FINDINGS: Lung Bases: Clear.

Liver:  Normal.

Spleen:  Normal.

Gallbladder:  Surgically absent.

Common bile duct:  Normal.

Pancreas:  Normal.

Adrenal glands:  Normal.

Kidneys:  Normal enhancement.  Normal delayed excretion.  Ureters
appear normal.

Stomach:  Normal.

Small bowel:  Mild mural thickening is present in proximal small
bowel, likely in the jejunum.  This is in the left upper quadrant
and left central abdomen (image number 50 series 2).  There are no
adjacent inflammatory changes or small bowel adenopathy.

Colon:   Normal appendix.  No inflammatory changes or mural
thickening.

Pelvic Genitourinary:  No free fluid.  Hysterectomy.  Urinary
bladder normal.  Midline abdominal scar, presumably for
hysterectomy.

Bones:  No aggressive osseous lesions.

Vasculature: Normal.
IMPRESSION: 1.  Mild mural thickening of proximal small bowel compatible with
enteritis, likely infectious but nonspecific.
2.  Cholecystectomy and hysterectomy.

## 2014-06-24 ENCOUNTER — Emergency Department (HOSPITAL_COMMUNITY)
Admission: EM | Admit: 2014-06-24 | Discharge: 2014-06-24 | Disposition: A | Discharge status: Home / Self Care | Payer: Self-pay | Attending: Emergency Medicine | Admitting: Emergency Medicine

## 2014-06-24 ENCOUNTER — Emergency Department (HOSPITAL_COMMUNITY): Payer: Self-pay

## 2014-06-24 ENCOUNTER — Encounter (HOSPITAL_COMMUNITY): Payer: Self-pay | Admitting: Emergency Medicine

## 2014-06-24 DIAGNOSIS — Z79899 Other long term (current) drug therapy: Secondary | ICD-10-CM | POA: Insufficient documentation

## 2014-06-24 DIAGNOSIS — J40 Bronchitis, not specified as acute or chronic: Secondary | ICD-10-CM

## 2014-06-24 DIAGNOSIS — I10 Essential (primary) hypertension: Secondary | ICD-10-CM | POA: Insufficient documentation

## 2014-06-24 DIAGNOSIS — Z792 Long term (current) use of antibiotics: Secondary | ICD-10-CM | POA: Insufficient documentation

## 2014-06-24 DIAGNOSIS — J209 Acute bronchitis, unspecified: Secondary | ICD-10-CM | POA: Insufficient documentation

## 2014-06-24 MED ORDER — AZITHROMYCIN 250 MG PO TABS
500.0000 mg | ORAL_TABLET | Freq: Once | ORAL | Status: AC
  Administered 2014-06-24: 500 mg via ORAL
  Filled 2014-06-24: qty 2

## 2014-06-24 MED ORDER — AZITHROMYCIN 250 MG PO TABS: ORAL_TABLET | ORAL | Status: DC

## 2014-06-24 NOTE — Discharge Instructions (Signed)
Drink plent of fluids and follow up with your md next week if not improving

## 2014-06-24 NOTE — ED Provider Notes (Signed)
CSN: [id number]     Arrival date & time 06/24/14  1832 History   First MD Initiated Contact with Patient 06/24/14 1926     Chief Complaint  Patient presents with  . Cough     (Consider location/radiation/quality/duration/timing/severity/associated sxs/prior Treatment) Patient is a 54 y.o. female presenting with cough. The history is provided by the patient (the pt complains of a nonproductive cough).  Cough Cough characteristics:  Non-productive Severity:  Moderate Onset quality:  Sudden Timing:  Constant Progression:  Waxing and waning Chronicity:  New Associated symptoms: no chest pain, no eye discharge, no headaches and no rash     Past Medical History  Diagnosis Date  . Hypertension     in past, resolved   Past Surgical History  Procedure Laterality Date  . Cholecystectomy    . Cesarean section      x2  . Abdominal hysterectomy    . Esophagogastroduodenoscopy  12/20/2011    Procedure: ESOPHAGOGASTRODUODENOSCOPY (EGD);  Surgeon: Danie Binder, MD;  [facility];  Service: Endoscopy;  Laterality: N/A;  1:15   Family History  Problem Relation Age of Onset  . Liver cancer Mother     ?states exposed to someone with hepatitis, ?cirrhosis  . Colon cancer Neg Hx    History  Substance Use Topics  . Smoking status: Never Smoker   . Smokeless tobacco: Not on file  . Alcohol Use: No   OB History    No data available     Review of Systems  Constitutional: Negative for appetite change and fatigue.  HENT: Negative for congestion, ear discharge and sinus pressure.   Eyes: Negative for discharge.  Respiratory: Positive for cough.   Cardiovascular: Negative for chest pain.  Gastrointestinal: Negative for abdominal pain and diarrhea.  Genitourinary: Negative for frequency and hematuria.  Musculoskeletal: Negative for back pain.  Skin: Negative for rash.  Neurological: Negative for seizures and headaches.  Psychiatric/Behavioral: Negative for hallucinations.       Allergies  Review of patient's allergies indicates no known allergies.  Home Medications   Prior to Admission medications   Medication Sig Start Date End Date Taking? Authorizing Provider  guaifenesin (ROBITUSSIN) 100 MG/5ML syrup Take 200 mg by mouth 3 (three) times daily as needed for cough.   Yes Historical Provider, MD  amoxicillin (AMOXIL) 500 MG capsule 2 PO BID FOR 10 DAYS Patient not taking: Reported on 06/24/2014 12/22/11   Danie Binder, MD  azithromycin (ZITHROMAX) 250 MG tablet Take one tablet a day and start 4/20 06/24/14   Milton Ferguson, MD  clarithromycin (BIAXIN) 500 MG tablet 1 PO BID FOR 10 DAYS. Patient not taking: Reported on 06/24/2014 12/22/11   Danie Binder, MD  HYDROcodone-acetaminophen (NORCO/VICODIN) 5-325 MG per tablet Take 1 tablet by mouth every 6 (six) hours as needed for pain. Patient not taking: Reported on 06/24/2014 12/15/11   Milton Ferguson, MD  omeprazole (PRILOSEC) 20 MG capsule 1 po bid 30 minutes before meals for 3 mos then once daily FOREVER Patient not taking: Reported on 06/24/2014 12/20/11   Danie Binder, MD  promethazine (PHENERGAN) 25 MG tablet Take 1 tablet (25 mg total) by mouth every 6 (six) hours as needed for nausea. Patient not taking: Reported on 06/24/2014 12/15/11   Milton Ferguson, MD   BP 129/65 mmHg  Pulse 77  Temp(Src) 98.5 F (36.9 C) (Oral)  Resp 18  Ht 5\' 1"  (1.549 m)  Wt 165 lb (74.844 kg)  BMI 31.19 kg/m2  SpO2 96% Physical Exam  Constitutional: She is oriented to person, place, and time. She appears well-developed.  HENT:  Head: Normocephalic.  Eyes: Conjunctivae and EOM are normal. No scleral icterus.  Neck: Neck supple. No thyromegaly present.  Cardiovascular: Normal rate and regular rhythm.  Exam reveals no gallop and no friction rub.   No murmur heard. Pulmonary/Chest: No stridor. She has no wheezes. She has no rales. She exhibits no tenderness.  Abdominal: She exhibits no distension. There is no  tenderness. There is no rebound.  Musculoskeletal: Normal range of motion. She exhibits no edema.  Lymphadenopathy:    She has no cervical adenopathy.  Neurological: She is oriented to person, place, and time. She exhibits normal muscle tone. Coordination normal.  Skin: No rash noted. No erythema.  Psychiatric: She has a normal mood and affect. Her behavior is normal.    ED Course  Procedures (including critical care time) Labs Review Labs Reviewed - No data to display  Imaging Review Dg Chest 2 View  06/24/2014   CLINICAL DATA:  Cough, sore throat, shortness of breath  EXAM: CHEST  2 VIEW  COMPARISON:  None.  FINDINGS: The heart size and mediastinal contours are within normal limits. Both lungs are clear. The visualized skeletal structures are unremarkable.  IMPRESSION: No active cardiopulmonary disease.   Electronically Signed   By: Kathreen Devoid   On: 06/24/2014 20:20     EKG Interpretation   Date/Time:  Tuesday June 24 2014 18:40:52 EDT Ventricular Rate:  85 PR Interval:  126 QRS Duration: 90 QT Interval:  352 QTC Calculation: 418 R Axis:   32 Text Interpretation:  Normal sinus rhythm Normal ECG Confirmed by Avaiyah Strubel   MD, Zakariah Urwin [phone]) on 06/24/2014 7:40:48 PM      MDM   Final diagnoses:  Bronchitis    persistent bronchitis,  tx with zpak  Milton Ferguson, MD 06/24/14 2126

## 2014-06-24 NOTE — ED Notes (Signed)
Pt reports cough,sore throat, chest discomfort since Friday. Mild auditory wheezes heard in triage. Upon auscultation clear breath sounds noted.

## 2014-07-11 ENCOUNTER — Encounter: Payer: Self-pay | Admitting: Family Medicine

## 2014-07-11 DIAGNOSIS — E785 Hyperlipidemia, unspecified: Secondary | ICD-10-CM | POA: Insufficient documentation

## 2014-07-14 ENCOUNTER — Ambulatory Visit (INDEPENDENT_AMBULATORY_CARE_PROVIDER_SITE_OTHER): Payer: BLUE CROSS/BLUE SHIELD | Admitting: Physician Assistant

## 2014-07-14 ENCOUNTER — Encounter: Payer: Self-pay | Admitting: Physician Assistant

## 2014-07-14 VITALS — BP 114/76 | HR 76 | Temp 98.9°F | Resp 18 | Ht 59.5 in | Wt 185.0 lb

## 2014-07-14 DIAGNOSIS — J988 Other specified respiratory disorders: Secondary | ICD-10-CM | POA: Diagnosis not present

## 2014-07-14 DIAGNOSIS — B9689 Other specified bacterial agents as the cause of diseases classified elsewhere: Principal | ICD-10-CM

## 2014-07-14 MED ORDER — LEVOFLOXACIN 750 MG PO TABS
750.0000 mg | ORAL_TABLET | Freq: Every day | ORAL | Status: DC
Start: 2014-07-14 — End: 2014-08-06

## 2014-07-14 NOTE — Progress Notes (Signed)
Patient ID: Catherine Bass MRN: [id number], DOB: 1960/03/18, 54 y.o. Date of Encounter: 07/14/2014, 2:31 PM    Chief Complaint:  Chief Complaint  Patient presents with  . new pt est care    c/o bronchitis x 3-4 weeks seen at ED tx w/ Z-pak  c/o pain left lung  continues to have cough and congestion  .      HPI: 54 y.o. year old [name] is here with Spanish interpreter. She is being seen as a new patient.  She was seen in the ER 06/24/2014. I have reviewed that note. Physical exam that day was normal with clear lungs with no wheezes rhonchi or rales. Chest x-ray showed no active cardiopulmonary disease. Normal. She was treated with Z-Pak.  Patient states that she took the Z-Pak as directed and completed all of it. However states that her cough has persisted. Still getting phlegm up when she coughs. Thick dark phlegm. Has had no fevers or chills. No nasal congestion or mucus from her nose. No sore throat or earache.  Pt says that she is also concerned because her father died of lung cancer. Says that this was diagnosed at age 33.  No other complaints or concerns today.     Home Meds:   Outpatient Prescriptions Prior to Visit  Medication Sig Dispense Refill  . guaifenesin (ROBITUSSIN) 100 MG/5ML syrup Take 200 mg by mouth 3 (three) times daily as needed for cough.    Marland Kitchen omeprazole (PRILOSEC) 20 MG capsule 1 po bid 30 minutes before meals for 3 mos then once daily FOREVER (Patient not taking: Reported on 06/24/2014) 62 capsule 11  . amoxicillin (AMOXIL) 500 MG capsule 2 PO BID FOR 10 DAYS (Patient not taking: Reported on 06/24/2014) 40 capsule 0  . azithromycin (ZITHROMAX) 250 MG tablet Take one tablet a day and start 4/20 4 tablet 0  . clarithromycin (BIAXIN) 500 MG tablet 1 PO BID FOR 10 DAYS. (Patient not taking: Reported on 06/24/2014) 20 tablet 0  . HYDROcodone-acetaminophen (NORCO/VICODIN) 5-325 MG per tablet Take 1 tablet by mouth every 6 (six) hours as needed for  pain. (Patient not taking: Reported on 06/24/2014) 20 tablet 0  . promethazine (PHENERGAN) 25 MG tablet Take 1 tablet (25 mg total) by mouth every 6 (six) hours as needed for nausea. (Patient not taking: Reported on 06/24/2014) 15 tablet 0   No facility-administered medications prior to visit.    Allergies: No Known Allergies    Review of Systems: See HPI for pertinent ROS. All other ROS negative.    Physical Exam: Blood pressure 114/76, pulse 76, temperature 98.9 F (37.2 C), temperature source Oral, resp. rate 18, height 4' 11.5" (1.511 m), weight 185 lb (83.915 kg)., Body mass index is 36.75 kg/(m^2). General:  WNWD [name]. Appears in no acute distress. HEENT: Normocephalic, atraumatic, eyes without discharge, sclera non-icteric, nares are without discharge. Bilateral auditory canals clear, TM's are without perforation, pearly grey and translucent with reflective cone of light bilaterally. Oral cavity moist, posterior pharynx without exudate, erythema, peritonsillar abscess.  Neck: Supple. No thyromegaly. No lymphadenopathy. Lungs: Clear bilaterally to auscultation without wheezes, rales, or rhonchi. Breathing is unlabored. Heart: Regular rhythm. No murmurs, rubs, or gallops. Msk:  Strength and tone normal for age. Extremities/Skin: Warm and dry. Neuro: Alert and oriented X 3. Moves all extremities spontaneously. Gait is normal. CNII-XII grossly in tact. Psych:  Responds to questions appropriately with a normal affect.     ASSESSMENT AND PLAN:  54 y.o.  year old female with  1. Bacterial respiratory infection - levofloxacin (LEVAQUIN) 750 MG tablet; Take 1 tablet (750 mg total) by mouth daily.  Dispense: 7 tablet; Refill: 0  Discussed that her chest x-ray was normal 06/24/2014. Recommand that she take Levaquin. If cough persists after completion of Levaquin, then make sure to follow-up with [place].  Patient is being seen as a new patient here to our office today. Asked if she  had been going to a provider in the past to have routine evaluations and preventive care and she says no that she has always just gone to the emergency room. Discussed with her scheduling a complete physical exam and she is agreeable to do so. She will schedule for early morning and come fasting to that appointment.   [address] [place], [place], [facility] 07/14/2014 2:31 PM

## 2014-08-06 ENCOUNTER — Other Ambulatory Visit: Payer: Self-pay | Admitting: Physician Assistant

## 2014-08-06 ENCOUNTER — Encounter: Payer: Self-pay | Admitting: Physician Assistant

## 2014-08-06 ENCOUNTER — Encounter (INDEPENDENT_AMBULATORY_CARE_PROVIDER_SITE_OTHER): Payer: Self-pay

## 2014-08-06 ENCOUNTER — Ambulatory Visit (INDEPENDENT_AMBULATORY_CARE_PROVIDER_SITE_OTHER): Payer: BLUE CROSS/BLUE SHIELD | Admitting: Physician Assistant

## 2014-08-06 VITALS — BP 110/80 | HR 71 | Temp 98.4°F | Resp 19 | Wt 174.0 lb

## 2014-08-06 DIAGNOSIS — Z0000 Encounter for general adult medical examination without abnormal findings: Secondary | ICD-10-CM

## 2014-08-06 DIAGNOSIS — E785 Hyperlipidemia, unspecified: Secondary | ICD-10-CM | POA: Diagnosis not present

## 2014-08-06 DIAGNOSIS — N8110 Cystocele, unspecified: Secondary | ICD-10-CM

## 2014-08-06 DIAGNOSIS — IMO0002 Reserved for concepts with insufficient information to code with codable children: Secondary | ICD-10-CM

## 2014-08-06 LAB — CBC WITH DIFFERENTIAL/PLATELET
BASOS ABS: 0 10*3/uL (ref 0.0–0.1)
Basophils Relative: 0 % (ref 0–1)
EOS ABS: 0.1 10*3/uL (ref 0.0–0.7)
Eosinophils Relative: 2 % (ref 0–5)
HCT: 41.6 % (ref 36.0–46.0)
HEMOGLOBIN: 13.8 g/dL (ref 12.0–15.0)
Lymphocytes Relative: 33 % (ref 12–46)
Lymphs Abs: 1.7 10*3/uL (ref 0.7–4.0)
MCH: 29.5 pg (ref 26.0–34.0)
MCHC: 33.2 g/dL (ref 30.0–36.0)
MCV: 88.9 fL (ref 78.0–100.0)
MONOS PCT: 9 % (ref 3–12)
MPV: 9.4 fL (ref 8.6–12.4)
Monocytes Absolute: 0.5 10*3/uL (ref 0.1–1.0)
NEUTROS ABS: 2.9 10*3/uL (ref 1.7–7.7)
NEUTROS PCT: 56 % (ref 43–77)
PLATELETS: 248 10*3/uL (ref 150–400)
RBC: 4.68 MIL/uL (ref 3.87–5.11)
RDW: 14.9 % (ref 11.5–15.5)
WBC: 5.2 10*3/uL (ref 4.0–10.5)

## 2014-08-06 LAB — LIPID PANEL
CHOL/HDL RATIO: 2.9 ratio
Cholesterol: 219 mg/dL — ABNORMAL HIGH (ref 0–200)
HDL: 76 mg/dL (ref >=46)
LDL CALC: 120 mg/dL — AB (ref 0–99)
Triglycerides: 113 mg/dL (ref <150)
VLDL: 23 mg/dL (ref 0–40)

## 2014-08-06 LAB — COMPLETE METABOLIC PANEL WITH GFR
ALT: 87 U/L — ABNORMAL HIGH (ref 0–35)
AST: 82 U/L — AB (ref 0–37)
Albumin: 4.1 g/dL (ref 3.5–5.2)
Alkaline Phosphatase: 125 U/L — ABNORMAL HIGH (ref 39–117)
BUN: 12 mg/dL (ref 6–23)
CO2: 27 mEq/L (ref 19–32)
CREATININE: 0.63 mg/dL (ref 0.50–1.10)
Calcium: 9.3 mg/dL (ref 8.4–10.5)
Chloride: 104 mEq/L (ref 96–112)
GLUCOSE: 93 mg/dL (ref 70–99)
Potassium: 4 mEq/L (ref 3.5–5.3)
SODIUM: 140 meq/L (ref 135–145)
Total Bilirubin: 0.7 mg/dL (ref 0.2–1.2)
Total Protein: 7.1 g/dL (ref 6.0–8.3)

## 2014-08-06 LAB — TSH: TSH: 2.134 u[IU]/mL (ref 0.350–4.500)

## 2014-08-06 NOTE — Progress Notes (Signed)
Patient ID: Catherine Bass MRN: [id number], DOB: September 25, 1960, 54 y.o. Date of Encounter: 08/06/2014,   Chief Complaint: Physical (CPE)  HPI: 54 y.o. y/o [name]  here for CPE.   Spanish Interpreter (through [facility]) is present throughout the visit.  Patient has no specific complaints or concerns today. She saw me for an office visit for bacterial respiratory infection as a new patient to our office on 07/14/14. At that time she reported that she had never had a primary care provider and had "always gone to the ER  "when she would need any medical treatment. She states that her respiratory symptoms that she was having at that prior visit have resolved.   Review of Systems: Consitutional: No fever, chills, fatigue, night sweats, lymphadenopathy. No significant/unexplained weight changes. Eyes: No visual changes, eye redness, or discharge. ENT/Mouth: No ear pain, sore throat, nasal drainage, or sinus pain. Cardiovascular: No chest pressure,heaviness, tightness or squeezing, even with exertion. No increased shortness of breath or dyspnea on exertion.No palpitations, edema, orthopnea, PND. Respiratory: No cough, hemoptysis, SOB, or wheezing. Gastrointestinal: No anorexia, dysphagia, reflux, pain, nausea, vomiting, hematemesis, diarrhea, constipation, BRBPR, or melena. Breast: No mass, nodules, bulging, or retraction. No skin changes or inflammation. No nipple discharge. No lymphadenopathy. Genitourinary: No dysuria, hematuria, incontinence, vaginal discharge, pruritis, burning, abnormal bleeding, or pain. Musculoskeletal: No decreased ROM, No joint pain or swelling. No significant pain in neck, back, or extremities. Skin: No rash, pruritis, or concerning lesions. Neurological: No headache, dizziness, syncope, seizures, tremors, memory loss, coordination problems, or paresthesias. Psychological: No anxiety, depression, hallucinations, SI/HI. Endocrine: No polydipsia, polyphagia,  polyuria, or known diabetes.No increased fatigue. No palpitations/rapid heart rate. No significant/unexplained weight change. All other systems were reviewed and are otherwise negative.  Past Medical History  Diagnosis Date  . Hyperlipidemia      Past Surgical History  Procedure Laterality Date  . Cholecystectomy    . Cesarean section      x2  . Abdominal hysterectomy    . Esophagogastroduodenoscopy  12/20/2011    Procedure: ESOPHAGOGASTRODUODENOSCOPY (EGD);  Surgeon: Danie Binder, MD;  Location: [facility];  Service: Endoscopy;  Laterality: N/A;  1:15    Home Meds:  Outpatient Prescriptions Prior to Visit  Medication Sig Dispense Refill  . guaifenesin (ROBITUSSIN) 100 MG/5ML syrup Take 200 mg by mouth 3 (three) times daily as needed for cough.    Marland Kitchen omeprazole (PRILOSEC) 20 MG capsule 1 po bid 30 minutes before meals for 3 mos then once daily FOREVER (Patient not taking: Reported on 06/24/2014) 62 capsule 11  . levofloxacin (LEVAQUIN) 750 MG tablet Take 1 tablet (750 mg total) by mouth daily. 7 tablet 0   No facility-administered medications prior to visit.    Allergies: No Known Allergies  History   Social History  . Marital Status: Married    Spouse Name: N/A  . Number of Children: N/A  . Years of Education: N/A   Occupational History  . Not on file.   Social History Main Topics  . Smoking status: Never Smoker   . Smokeless tobacco: Not on file  . Alcohol Use: No  . Drug Use: No  . Sexual Activity: Not on file   Other Topics Concern  . Not on file   Social History Narrative    Family History  Problem Relation Age of Onset  . Liver cancer Mother     ?states exposed to someone with hepatitis, ?cirrhosis  . Colon cancer Neg Hx   .  Cancer Father 10    Lung Cancer    Physical Exam: Blood pressure 110/80, pulse 71, temperature 98.4 F (36.9 C), temperature source Oral, resp. rate 19, weight 174 lb (78.926 kg)., Body mass index is 34.57  kg/(m^2). General: Well developed, well nourished, [name]. Appears in no acute distress. HEENT: Normocephalic, atraumatic. Conjunctiva pink, sclera non-icteric. Pupils 2 mm constricting to 1 mm, round, regular, and equally reactive to light and accomodation. EOMI. Internal auditory canal clear. TMs with good cone of light and without pathology. Nasal mucosa pink. Nares are without discharge. No sinus tenderness. Oral mucosa pink.  Pharynx without exudate.   Neck: Supple. Trachea midline. No thyromegaly. Full ROM. No lymphadenopathy.No Carotid Bruits. Lungs: Clear to auscultation bilaterally without wheezes, rales, or rhonchi. Breathing is of normal effort and unlabored. Cardiovascular: RRR with S1 S2. No murmurs, rubs, or gallops. Distal pulses 2+ symmetrically. No carotid or abdominal bruits. Breast: Symmetrical. No masses. Nipples without discharge. Abdomen: Soft, non-tender, non-distended with normoactive bowel sounds. No hepatosplenomegaly or masses. No rebound/guarding. No CVA tenderness. No hernias.  Genitourinary:  External genitalia without lesions. Vaginal mucosa pink.No discharge present.  No adnexal mass or tenderness.  Exam c/w hysterectomy. She does have bladder prolapse/cystocele.  Musculoskeletal: Full range of motion and 5/5 strength throughout. Without swelling, atrophy, tenderness, crepitus, or warmth. Extremities without clubbing, cyanosis, or edema.  Skin: Warm and moist without erythema, ecchymosis, wounds, or rash. Neuro: A+Ox3. CN II-XII grossly intact. Moves all extremities spontaneously. Full sensation throughout. Normal gait. DTR 2+ throughout upper and lower extremities.  Psych:  Responds to questions appropriately with a normal affect.   Assessment/Plan:  54 y.o. y/o female here for CPE  1. Visit for preventive health examination  A. Screening Labs: She is fasting. - CBC with Differential/Platelet - COMPLETE METABOLIC PANEL WITH GFR - Lipid panel - TSH -  Vit D  25 hydroxy (rtn osteoporosis monitoring)  B. Pap: She has had hysterectomy that was NOT performed secondary to cancer. Therefore, does not need any further Pap smear. Pelvic exam today is normal except she does have cystocele. Discussed this finding with her. She states that this is asymptomatic and does not want to pursue any treatment for this.   C. Screening Mammogram: She reports that she has had no mammogram in many years. Agreeable for [place] to schedule mammogram. - MM Digital Screening; Future  D. DEXA/BMD:  She has had hysterectomy but no oophorectomy. She is only age 50. She does not smoke. We can wait to do bone density scan in the future.  E. Colorectal Cancer Screening: She has had endoscopy 2 per patient report. However she states that she has never had a colonoscopy. Discussed screening colonoscopy and she is agreeable. Made a note to schedule appointment with Dr. Carlyon Prows fields he was the GI that she has seen in the past. - Ambulatory referral to Gastroenterology   F. Immunizations:  Influenza:N/A--it is June Tetanus:  Reports that she knows she has had a tetanus vaccine in the last 10 years but does not know where she had it or the exact date. Pneumococcal: She has no indication to require pneumonia vaccine until age 47. Zostavax: Not indicated until age 35.    2. Hyperlipidemia She reports that she has been told she had high cholesterol in the past. Says that it had been treated with diet changes and that she has never taken medication for this. - COMPLETE METABOLIC PANEL WITH GFR - Lipid panel  3. Bladder Prolapse/Cystocele- See  note next to Pap smear note above.     Signed, [address] [place], [place], [facility] 08/06/2014 11:05 AM

## 2014-08-07 LAB — HEPATITIS PANEL, ACUTE
HCV AB: NEGATIVE
HEP A IGM: NONREACTIVE
HEP B S AG: NEGATIVE
Hep B C IgM: NONREACTIVE

## 2014-08-07 LAB — VITAMIN D 25 HYDROXY (VIT D DEFICIENCY, FRACTURES): Vit D, 25-Hydroxy: 14 ng/mL — ABNORMAL LOW (ref 30–100)

## 2014-08-13 ENCOUNTER — Encounter: Payer: Self-pay | Admitting: *Deleted

## 2014-08-26 ENCOUNTER — Encounter: Payer: Self-pay | Admitting: Family Medicine

## 2014-08-26 ENCOUNTER — Other Ambulatory Visit: Payer: Self-pay | Admitting: Family Medicine

## 2014-08-26 DIAGNOSIS — R945 Abnormal results of liver function studies: Principal | ICD-10-CM

## 2014-08-26 DIAGNOSIS — E559 Vitamin D deficiency, unspecified: Secondary | ICD-10-CM

## 2014-08-26 DIAGNOSIS — R7989 Other specified abnormal findings of blood chemistry: Secondary | ICD-10-CM

## 2014-08-26 MED ORDER — VITAMIN D (ERGOCALCIFEROL) 1.25 MG (50000 UNIT) PO CAPS: 50000.0000 [IU] | ORAL_CAPSULE | ORAL | Status: DC

## 2014-08-27 ENCOUNTER — Encounter: Payer: Self-pay | Admitting: Family Medicine

## 2014-08-28 ENCOUNTER — Encounter: Payer: Self-pay | Admitting: *Deleted

## 2014-08-28 NOTE — Telephone Encounter (Signed)
This encounter was created in error - please disregard.

## 2014-09-05 ENCOUNTER — Ambulatory Visit (HOSPITAL_COMMUNITY)
Admission: RE | Admit: 2014-09-05 | Discharge: 2014-09-05 | Disposition: A | Discharge status: Home / Self Care | Payer: BLUE CROSS/BLUE SHIELD | Source: Ambulatory Visit | Attending: Physician Assistant | Admitting: Physician Assistant

## 2014-09-05 DIAGNOSIS — R932 Abnormal findings on diagnostic imaging of liver and biliary tract: Secondary | ICD-10-CM | POA: Diagnosis not present

## 2014-09-05 DIAGNOSIS — R945 Abnormal results of liver function studies: Secondary | ICD-10-CM

## 2014-09-05 DIAGNOSIS — R7989 Other specified abnormal findings of blood chemistry: Secondary | ICD-10-CM | POA: Insufficient documentation

## 2014-09-09 ENCOUNTER — Encounter: Payer: Self-pay | Admitting: Gastroenterology

## 2014-09-10 ENCOUNTER — Encounter: Payer: Self-pay | Admitting: Family Medicine

## 2014-09-10 DIAGNOSIS — R7989 Other specified abnormal findings of blood chemistry: Secondary | ICD-10-CM | POA: Insufficient documentation

## 2014-09-10 DIAGNOSIS — R945 Abnormal results of liver function studies: Secondary | ICD-10-CM

## 2014-09-12 ENCOUNTER — Encounter: Payer: Self-pay | Admitting: Family Medicine

## 2014-09-19 ENCOUNTER — Ambulatory Visit: Payer: Self-pay | Admitting: Gastroenterology

## 2014-09-30 ENCOUNTER — Ambulatory Visit: Payer: BLUE CROSS/BLUE SHIELD | Admitting: Gastroenterology

## 2014-09-30 ENCOUNTER — Encounter: Payer: Self-pay | Admitting: Gastroenterology

## 2014-09-30 ENCOUNTER — Telehealth: Payer: Self-pay | Admitting: Gastroenterology

## 2014-09-30 NOTE — Telephone Encounter (Signed)
PATIENT WAS A NO SHOW AND LETTER WAS SENT  °

## 2015-02-12 ENCOUNTER — Encounter: Payer: Self-pay | Admitting: Nurse Practitioner

## 2015-02-12 ENCOUNTER — Telehealth: Payer: Self-pay

## 2015-02-12 ENCOUNTER — Other Ambulatory Visit: Payer: Self-pay | Admitting: Nurse Practitioner

## 2015-02-12 ENCOUNTER — Ambulatory Visit (INDEPENDENT_AMBULATORY_CARE_PROVIDER_SITE_OTHER): Payer: Self-pay | Admitting: Nurse Practitioner

## 2015-02-12 VITALS — BP 103/56 | HR 68 | Temp 98.6°F | Ht 62.0 in | Wt 180.0 lb

## 2015-02-12 DIAGNOSIS — Z1211 Encounter for screening for malignant neoplasm of colon: Secondary | ICD-10-CM

## 2015-02-12 NOTE — Progress Notes (Signed)
Primary Care Physician:  Karis Juba, PA-C Primary Gastroenterologist:  Dr. Oneida Alar  Chief Complaint  Patient presents with  . set up TCS    HPI:   54 year old female presents on referral from PCP to set up first-ever colonoscopy.   Past Medical History  Diagnosis Date  . Hyperlipidemia     Past Surgical History  Procedure Laterality Date  . Cholecystectomy    . Cesarean section      x2  . Abdominal hysterectomy    . Esophagogastroduodenoscopy  12/20/2011    [id number] gastritis/doudenal mucosa normal    Current Outpatient Prescriptions  Medication Sig Dispense Refill  . Simethicone (MAALOX ANTI-GAS PO) Take by mouth as needed.    . Vitamin D, Ergocalciferol, (DRISDOL) 50000 UNITS CAPS capsule Take 1 capsule (50,000 Units total) by mouth every 7 (seven) days. 12 capsule 0  . guaifenesin (ROBITUSSIN) 100 MG/5ML syrup Take 200 mg by mouth 3 (three) times daily as needed for cough.    Marland Kitchen omeprazole (PRILOSEC) 20 MG capsule 1 po bid 30 minutes before meals for 3 mos then once daily FOREVER (Patient not taking: Reported on 02/12/2015) 62 capsule 11   No current facility-administered medications for this visit.    Allergies as of 02/12/2015  . (No Known Allergies)    Family History  Problem Relation Age of Onset  . Liver cancer Mother     ?states exposed to someone with hepatitis, ?cirrhosis  . Colon cancer Neg Hx   . Cancer Father 75    Lung Cancer    Social History   Social History  . Marital Status: Married    Spouse Name: N/A  . Number of Children: N/A  . Years of Education: N/A   Occupational History  . Not on file.   Social History Main Topics  . Smoking status: Never Smoker   . Smokeless tobacco: Not on file  . Alcohol Use: No  . Drug Use: No  . Sexual Activity: Not on file   Other Topics Concern  . Not on file   Social History Narrative    Review of Systems: General: Negative for anorexia, weight loss, fever, chills, fatigue,  weakness. Eyes: Negative for vision changes.  ENT: Negative for hoarseness, difficulty swallowing , nasal congestion. CV: Negative for chest pain, angina, palpitations, dyspnea on exertion, peripheral edema.  Respiratory: Negative for dyspnea at rest, dyspnea on exertion, cough, sputum, wheezing.  GI: See history of present illness. GU:  Negative for dysuria, hematuria, urinary incontinence, urinary frequency, nocturnal urination.  MS: Negative for joint pain, low back pain.  Derm: Negative for rash or itching.  Neuro: Negative for weakness, abnormal sensation, seizure, frequent headaches, memory loss, confusion.  Psych: Negative for anxiety, depression, suicidal ideation, hallucinations.  Endo: Negative for unusual weight change.  Heme: Negative for bruising or bleeding. Allergy: Negative for rash or hives.    Physical Exam: BP 103/56 mmHg  Pulse 68  Temp(Src) 98.6 F (37 C)  Ht 5\' 2"  (1.575 m)  Wt 180 lb (81.647 kg)  BMI 32.91 kg/m2 General:   Alert and oriented. Pleasant and cooperative. Well-nourished and well-developed.  Head:  Normocephalic and atraumatic. Eyes:  Without icterus, sclera clear and conjunctiva pink.  Ears:  Normal auditory acuity. Mouth:  No deformity or lesions, oral mucosa pink.  Throat/Neck:  Supple, without mass or thyromegaly. Cardiovascular:  S1, S2 present without murmurs appreciated. Normal pulses noted. Extremities without clubbing or edema. Respiratory:  Clear to auscultation bilaterally. No wheezes, rales,  or rhonchi. No distress.  Gastrointestinal:  +BS, soft, non-tender and non-distended. No HSM noted. No guarding or rebound. No masses appreciated.  Rectal:  Deferred  Musculoskalatal:  Symmetrical without gross deformities. Normal posture. Skin:  Intact without significant lesions or rashes. Neurologic:  Alert and oriented x4;  grossly normal neurologically. Psych:  Alert and cooperative. Normal mood and affect. Heme/Lymph/Immune: No  significant cervical adenopathy. No excessive bruising noted.    02/12/2015 10:53 AM

## 2015-02-16 DIAGNOSIS — Z1211 Encounter for screening for malignant neoplasm of colon: Secondary | ICD-10-CM | POA: Insufficient documentation

## 2015-02-19 ENCOUNTER — Other Ambulatory Visit: Payer: Self-pay

## 2015-02-19 DIAGNOSIS — Z1211 Encounter for screening for malignant neoplasm of colon: Secondary | ICD-10-CM

## 2015-02-19 MED ORDER — NA SULFATE-K SULFATE-MG SULF 17.5-3.13-1.6 GM/177ML PO SOLN: 1.0000 | ORAL | Status: DC

## 2015-02-19 NOTE — Telephone Encounter (Signed)
Gastroenterology Pre-Procedure Review  Request Date: 02/12/2015 Requesting Physician: Karis Juba, [place]  PATIENT REVIEW QUESTIONS: The patient responded to the following health history questions as indicated:    1. Diabetes Melitis: no 2. Joint replacements in the past 12 months: no 3. Major health problems in the past 3 months: no 4. Has an artificial valve or MVP: no 5. Has a defibrillator: no 6. Has been advised in past to take antibiotics in advance of a procedure like teeth cleaning: no 7. Family history of colon cancer: no  8. Alcohol Use: no 9. History of sleep apnea: no     MEDICATIONS & ALLERGIES:    Patient reports the following regarding taking any blood thinners:   Plavix? no Aspirin? no Coumadin? no  Patient confirms/reports the following medications:  Current Outpatient Prescriptions  Medication Sig Dispense Refill  . guaifenesin (ROBITUSSIN) 100 MG/5ML syrup Take 200 mg by mouth 3 (three) times daily as needed for cough.    Marland Kitchen omeprazole (PRILOSEC) 20 MG capsule 1 po bid 30 minutes before meals for 3 mos then once daily FOREVER (Patient not taking: Reported on 02/12/2015) 62 capsule 11  . Simethicone (MAALOX ANTI-GAS PO) Take by mouth as needed.    . Vitamin D, Ergocalciferol, (DRISDOL) 50000 UNITS CAPS capsule Take 1 capsule (50,000 Units total) by mouth every 7 (seven) days. (Patient not taking: Reported on 02/12/2015) 12 capsule 0   No current facility-administered medications for this visit.    Patient confirms/reports the following allergies:  No Known Allergies  No orders of the defined types were placed in this encounter.    AUTHORIZATION INFORMATION Primary Insurance:   ID #:   Group #:  Pre-Cert / Auth required:  Pre-Cert / Auth #:   Secondary Insurance:   ID #: Group #:  Pre-Cert / Auth required:  Pre-Cert / Auth #:   SCHEDULE INFORMATION: Procedure has been scheduled as follows:  Date:   03/06/2015             Time:  10:15 Am Location: [facility] Short Stay  This Gastroenterology Pre-Precedure Review Form is being routed to the following provider(s): Barney Drain, MD

## 2015-02-19 NOTE — Telephone Encounter (Signed)
Ok to schedule.

## 2015-02-19 NOTE — Telephone Encounter (Signed)
Rx sent to the pharmacy and instructions mailed to pt.  

## 2015-03-03 ENCOUNTER — Telehealth: Payer: Self-pay

## 2015-03-03 NOTE — Telephone Encounter (Signed)
I called [employer] and spoke to Luke who said a PA is not required for the screening colonoscopy as outpatient.

## 2015-03-06 ENCOUNTER — Encounter (HOSPITAL_COMMUNITY)
Admission: RE | Disposition: A | Discharge status: Home / Self Care | Payer: Self-pay | Source: Ambulatory Visit | Attending: Gastroenterology

## 2015-03-06 ENCOUNTER — Ambulatory Visit (HOSPITAL_COMMUNITY)
Admission: RE | Admit: 2015-03-06 | Discharge: 2015-03-06 | Disposition: A | Discharge status: Home / Self Care | Payer: BLUE CROSS/BLUE SHIELD | Source: Ambulatory Visit | Attending: Gastroenterology | Admitting: Gastroenterology

## 2015-03-06 ENCOUNTER — Encounter (HOSPITAL_COMMUNITY): Payer: Self-pay | Admitting: *Deleted

## 2015-03-06 DIAGNOSIS — D124 Benign neoplasm of descending colon: Secondary | ICD-10-CM | POA: Diagnosis not present

## 2015-03-06 DIAGNOSIS — Q438 Other specified congenital malformations of intestine: Secondary | ICD-10-CM | POA: Insufficient documentation

## 2015-03-06 DIAGNOSIS — Z801 Family history of malignant neoplasm of trachea, bronchus and lung: Secondary | ICD-10-CM | POA: Diagnosis not present

## 2015-03-06 DIAGNOSIS — K644 Residual hemorrhoidal skin tags: Secondary | ICD-10-CM | POA: Diagnosis not present

## 2015-03-06 DIAGNOSIS — Z808 Family history of malignant neoplasm of other organs or systems: Secondary | ICD-10-CM | POA: Diagnosis not present

## 2015-03-06 DIAGNOSIS — D122 Benign neoplasm of ascending colon: Secondary | ICD-10-CM

## 2015-03-06 DIAGNOSIS — Z1211 Encounter for screening for malignant neoplasm of colon: Secondary | ICD-10-CM

## 2015-03-06 DIAGNOSIS — D120 Benign neoplasm of cecum: Secondary | ICD-10-CM | POA: Diagnosis not present

## 2015-03-06 DIAGNOSIS — K648 Other hemorrhoids: Secondary | ICD-10-CM | POA: Insufficient documentation

## 2015-03-06 DIAGNOSIS — D123 Benign neoplasm of transverse colon: Secondary | ICD-10-CM

## 2015-03-06 HISTORY — PX: COLONOSCOPY: SHX5424

## 2015-03-06 SURGERY — COLONOSCOPY
Anesthesia: Moderate Sedation

## 2015-03-06 MED ORDER — STERILE WATER FOR IRRIGATION IR SOLN
Status: DC | PRN
  Administered 2015-03-06: 08:00:00

## 2015-03-06 MED ORDER — SODIUM CHLORIDE 0.9 % IJ SOLN
INTRAMUSCULAR | Status: AC
  Filled 2015-03-06: qty 3

## 2015-03-06 MED ORDER — MEPERIDINE HCL 100 MG/ML IJ SOLN
INTRAMUSCULAR | Status: AC
  Filled 2015-03-06: qty 2

## 2015-03-06 MED ORDER — MIDAZOLAM HCL 5 MG/5ML IJ SOLN
INTRAMUSCULAR | Status: DC | PRN
  Administered 2015-03-06: 1 mg via INTRAVENOUS
  Administered 2015-03-06: 2 mg via INTRAVENOUS
  Administered 2015-03-06: 1 mg via INTRAVENOUS

## 2015-03-06 MED ORDER — PROMETHAZINE HCL 25 MG/ML IJ SOLN
INTRAMUSCULAR | Status: AC
  Filled 2015-03-06: qty 1

## 2015-03-06 MED ORDER — SODIUM CHLORIDE 0.9 % IV SOLN
INTRAVENOUS | Status: DC
  Administered 2015-03-06: 08:00:00 via INTRAVENOUS

## 2015-03-06 MED ORDER — MIDAZOLAM HCL 5 MG/5ML IJ SOLN
INTRAMUSCULAR | Status: AC
  Filled 2015-03-06: qty 10

## 2015-03-06 MED ORDER — MEPERIDINE HCL 100 MG/ML IJ SOLN
INTRAMUSCULAR | Status: DC | PRN
  Administered 2015-03-06 (×2): 25 mg via INTRAVENOUS

## 2015-03-06 MED ORDER — PROMETHAZINE HCL 25 MG/ML IJ SOLN
12.5000 mg | Freq: Once | INTRAMUSCULAR | Status: AC
  Administered 2015-03-06: 12.5 mg via INTRAVENOUS

## 2015-03-06 NOTE — Discharge Instructions (Signed)
You have HEMORRHOIDES. YOU HAD quatros Plipos en el colon REMOTO.   FOLLOW A Dieta con alto contenido de [place]. AVOID ITEMS THAT CAUSE BLOATING. SEE INFO BELOW.  YOUR BIOPSY RESULTS WILL BE AVAILABLE IN MY CHART JAN 3 AND MY OFFICE WILL CONTACT YOU IN 14 DAYS WITH YOUR RESULTS.   Next Colonoscopa in 3-5 anos.  Colonoscopa: cuidados posteriores (Colonoscopy, Care After) Estas indicaciones le proporcionan informacin general acerca de cmo deber cuidarse despus del procedimiento. El mdico tambin podr darle instrucciones especficas. Comunquese con el mdico si tiene algn problema o tiene preguntas despus del procedimiento. CUIDADOS EN EL HOGAR  No conduzca durante 24horas.  No firme papeles importantes ni use maquinaria pesada durante 24horas.  Puede ducharse.  Puede retomar las actividades habituales, pero hgalo ms despacio durante las primeras 24horas.  Durante las primeras 24horas, descanse con frecuencia.  Camine o pngase compresas tibias en el vientre (abdomen) si tiene clicos intestinales o gases.  Beba suficiente lquido para mantener el pis (orina) claro o de color amarillo plido.  Retome su dieta normal. No coma comidas pesadas ni fritas.  No tome alcohol durante 24horas, o segn el mdico le indique.  Tome solo los medicamentos segn le haya indicado el mdico. Si se obtuvo una muestra de tejido (biopsia) durante el procedimiento:   No tome aspirina ni anticoagulantes durante 7das, o segn el mdico le indique.  No tome alcohol durante 7das, o segn el mdico le indique.  Consuma alimentos livianos durante las primeras 24horas. SOLICITE AYUDA SI: An hay una pequea cantidad de sangre en la materia fecal (heces) 2 o 3das despus del procedimiento. SOLICITE AYUDA DE INMEDIATO SI:  Hay ms que una pequea cantidad de [employer] materia fecal.  Observa grumos de tejido (cogulos de [place]) en la materia fecal.  Tiene el vientre  inflamado (hinchado).  Tiene malestar estomacal (nuseas) o vomita.  Tiene fiebre.  Siente que Conservation officer, historic buildings en el vientre empeora y no se alivia con los medicamentos.   Dieta con alto contenido de fibra  (High [employer] Health Diet)  La fibra se encuentra en frutas, verduras y granos. Una dieta con alto contenido en fibras se favorece con la adicin de ms granos enteros, legumbres, frutas y verduras en su dieta. La cantidad recomendada de fibra para los hombres adultos es de 38 g por da. Para las mujeres adultas es de 25 g por da. Las [employer] y las que amamantan deben consumir 10 gramos de fibra por Training and development officer. Si usted tiene un problema digestivo o intestinal, consulte a su mdico antes de la adicin de alimentos ricos en fibra a su dieta. Coma una variedad de alimentos ricos en fibra en lugar de slo unos pocos.  OBJETIVO  Aumentar la masa fecal.  Tener deposiciones ms regulares para evitar el estreimiento.  Reducir el colesterol.  Para evitar comer en exceso. [place]?  En caso de estreimiento y hemorroides.  En caso de diverticulosis no complicada (enfermedad intestinal) y en el sndrome del colon irritable.  Si necesita ayuda para el control de [place].  Si desea mejorar su dieta como medida de proteccin contra la aterosclerosis, la diabetes y Science writer. [place] y cereales integrales.  Frutas, como las [place], [place], pltanos, fresas, Development worker, community y peras.  Verduras, como guisantes, zanahorias, batatas, remolachas, brcoli, repollo, espinacas y alcauciles.  Legumbres, las arvejas, soja, lentejas.  Almendras. [place] DE LOS ALIMENTOS  Almidones y granos / Fibra Diettica (g)  Cheerios, 1 taza / 3 g  Corn Flakes, 1 taza / 0,7 g  Arroz inflado, 1  tazas / 0,3 g  Harina de avena instantnea (cocida),  taza / 2 g  Cereal de trigo escarchado, 1 taza / 5,1 g  Arroz marrn grano largo (cocido), 1 taza / 3,5 g  Arroz blanco grano largo  (cocido), 1 taza / 0,6 g  Macarrones enriquecidos (cocidos), 1 taza / 2,5 g Legumbres / Fibra Diettica (g)  Frijoles cocidos (enlatados, crudos o vegetarianos),  taza / 5,2 g  Frijoles (enlatados),  taza / 6,8 g  Frijoles pintos (cocidos),  taza / 5,5 g Panes y Administrator / Heritage manager (g)  Galletas de graham o miel, 2 plazas / 0,7 g  Galletitas saladas, 3 unidades / 0,3 g  Pretzels salados comunes, 10 pedazos / 1,8 g  Pan integral, 1 rebanada / 1,9 g  Pan blanco, 1 rebanada / 0,7 g  Pan con pasas, 1 rebanada / 1,2 g  Bagel 3 oz / 2 g  Tortilla de harina, 1 oz / 0.9 g  Tortilla de maz, 1 pequea / 1,5 g  Pan de amburguesa o hot dog, 1 pequeo / 0,9 g Frutas / Fibra Diettica (g)  Manzana con piel, 1 mediana / 4,4 g  Pur de [employer],  taza / 1,5 g  [place],  mediano / 1,5 g  Uvas, 10 uvas / 0,4 g  Naranja, 1 pequea / 2,3 g  Pasas, 1,5 oz / 1.6 g  Meln, 1 taza / 1,4 g Vegetales / Fibra Diettica (g)  Judas verdes (en conserva),  taza / 1,3 g  Zanahorias (cocido),  taza / 2,3 g  Broccoli (cocido),  taza / 2,8 g  Guisantes (cocidos),  taza / 4,4 g  Pur de papas,  taza / 1,6 g  Lechuga, 1 taza / 0,5 g  Maz (en lata),  taza / 1,6 g  Tomate,  taza / 1,1 g 1 cup / 3 g.  Plipos en el colon  (Colon Polyps)  Los plipos son masas de tejido que crecen dentro del cuerpo. Los plipos pueden desarrollarse en el intestino grueso (colon). La mayora de los plipos son no cancerosos (benignos). Sin embargo, algunos plipos pueden convertirse en cancerosos con el tiempo. Los plipos que sean ms grandes que un guisante pueden ser [employer]. Para estar seguros, los mdicos extirpan y [employer] plipos.  CAUSAS  Se forman cuando ciertas mutaciones genticas hacen que las clulas se desarrollen y se dividan por dems.  FACTORES DE RIESGO  Hay un nmero de factores de riesgo que pueden aumentar las probabilidades de padecer plipos en el colon. Ellos son:    Ser mayor de 13 aos de edad.  Historia familiar de cncer o plipos de colon.  Ciertas enfermedades crnicas como la colitis o la enfermedad de Crohn.  Tener sobrepeso.  El hbito de fumar.  El sedentarismo.  Beber alcohol en exceso. SNTOMAS  La mayor parte de los plipos no causa sntomas. Si se presentan sntomas, stos pueden ser:  [employer] materia fecal. Heces de color rojo oscuro o negro.  Constipacin o diarrea que duran ms de 1 semana. DIAGNSTICO  [place] persona con frecuencia no sabe que tiene plipos [employer] su mdico los halla durante un examen fsico de [place]. El mdico puede usar 4 tipos de pruebas para Hydrographic surveyor plipos:  Examen Musician. Se colocar guantes y palpar el interior del recto. Esta prueba detectar solo plipos en  el recto.  Enema de bario. El mdico introduce un lquido llamado bario en el recto y luego toma radiografas del colon. El bario hace que el colon se vea blanco. Los plipos son de color oscuro, por lo que son fciles de Chiropodist.  Sigmoideoscopia. Se coloca un tubo delgado y flexible (sigmoideoscopio) en el recto. Este sigmoideoscopio tiene [place] fuente de luz y Ardelia Mems pequea cmara de video. El mdico [place] el sigmoideoscopio para observar el ltimo tercio del colon.  Colonoscopa. Esta prueba es similar a la sigmoideoscopia, pero el mdico examina todo el colon. Este es el mtodo ms frecuente para Hydrographic surveyor y extirpar los plipos. TRATAMIENTO  Todo plipo ser extirpado Daneil Dan sigmoideoscopa o una colonoscopa. Luego se analizan para Heritage manager.  PREVENCIN  Para disminuir los riesgos de volver a Best boy plipos en el colon:  Coma mucha fruta y [place]. Evite las comidas grasas.  No fume.  Evite consumir alcohol.  Monserrate.  Baje de peso segn las indicaciones de su mdico.  Consuma mucho clcio y folato. Las comidas que contienen calcio son la [place], los quesos y el brcoli. Las comidas  que contienen folato son los garbanzos, los frijoles rojos y Nurse, mental health.

## 2015-03-06 NOTE — H&P (Signed)
  Primary Care Physician:  Karis Juba, PA-C Primary Gastroenterologist:  Dr. Oneida Alar  Pre-Procedure History & Physical: HPI:  Catherine Bass is a 54 y.o. female here for [place].  Past Medical History  Diagnosis Date  . Hyperlipidemia     Past Surgical History  Procedure Laterality Date  . Cholecystectomy    . Cesarean section      x2  . Abdominal hysterectomy    . Esophagogastroduodenoscopy  12/20/2011    [id number] gastritis/doudenal mucosa normal    Prior to Admission medications   Medication Sig Start Date End Date Taking? Authorizing Provider  Na Sulfate-K Sulfate-Mg Sulf (SUPREP BOWEL PREP) SOLN Take 1 kit by mouth as directed. 02/19/15  Yes Danie Binder, MD    Allergies as of 02/19/2015  . (No Known Allergies)    Family History  Problem Relation Age of Onset  . Liver cancer Mother     ?states exposed to someone with hepatitis, ?cirrhosis  . Colon cancer Neg Hx   . Cancer Father 39    Lung Cancer    Social History   Social History  . Marital Status: Married    Spouse Name: N/A  . Number of Children: N/A  . Years of Education: N/A   Occupational History  . Not on file.   Social History Main Topics  . Smoking status: Never Smoker   . Smokeless tobacco: Not on file  . Alcohol Use: No  . Drug Use: No  . Sexual Activity: Not on file   Other Topics Concern  . Not on file   Social History Narrative    Review of Systems: See HPI, otherwise negative ROS   Physical Exam: BP 105/55 mmHg  Pulse 75  Temp(Src) 98.2 F (36.8 C) (Oral)  Resp 14  Ht '5\' 1"'$  (1.549 m)  Wt 180 lb (81.647 kg)  BMI 34.03 kg/m2  SpO2 97% General:   Alert,  pleasant and cooperative in NAD Head:  Normocephalic and atraumatic. Neck:  Supple; Lungs:  Clear throughout to auscultation.    Heart:  Regular rate and rhythm. Abdomen:  Soft, nontender and nondistended. Normal bowel sounds, without guarding, and without rebound.   Neurologic:  Alert and   oriented x4;  grossly normal neurologically.  Impression/Plan:     SCREENING  Plan:  1. TCS TODAY

## 2015-03-06 NOTE — Op Note (Signed)
[facility] [address] [place], [address]   COLONOSCOPY PROCEDURE REPORT  PATIENT: Catherine Bass, Catherine Bass  MR#: [id number] BIRTHDATE: 1960/10/16 , 60  yrs. old GENDER: female ENDOSCOPIST: Danie Binder, MD REFERRED [id number] Ardath Sax, PA-C PROCEDURE DATE:  03/18/15 PROCEDURE:   Colonoscopy with cold biopsy polypectomy and Colonoscopy with snare polypectomy INDICATIONS:average risk patient for colon cancer. MEDICATIONS: Promethazine (Phenergan) 12.5 mg IV, Demerol 50 mg IV, and Versed 4 mg IV  DESCRIPTION OF PROCEDURE:    Physical exam was performed.  Informed consent was obtained from the patient after explaining the benefits, risks, and alternatives to procedure.  The patient was connected to monitor and placed in left lateral position. Continuous oxygen was provided by nasal cannula and IV medicine administered through an indwelling cannula.  After administration of sedation and rectal exam, the patients rectum was intubated and the [id number]  colonoscope was advanced under direct visualization to the cecum.  The scope was removed slowly by carefully examining the color, texture, anatomy, and integrity mucosa on the way out.  The patient was recovered in endoscopy and discharged home in satisfactory condition. Estimated blood loss is zero unless otherwise noted in this procedure report.    COLON FINDINGS: Two sessile polyps ranging from 2 to 72mm in size were found in the ascending colon and at the cecum.  A polypectomy was performed with cold forceps.  , Two sessile polyps ranging from 5 to 11mm in size were found in the transverse colon and descending colon.  A polypectomy was performed using snare cautery.  , The examination was otherwise normal.  , The colon was redundant. Manual abdominal counter-pressure was used to reach the cecum, Moderate sized internal hemorrhoids were found.  , and Large external hemorrhoids were found.  PREP QUALITY:  excellent.  CECAL W/D TIME: 17       minutes COMPLICATIONS: None  ENDOSCOPIC IMPRESSION: 1.   FOUR COLON polyps REMOVED 2.   Moderate sized internal hemorrhoids 3.   The LEFT colon IS redundant 4.   Large external hemorrhoids  RECOMMENDATIONS: AWAIT BIOPSY HIGH FIBER DIET NEXTTCS IN 3-5 YEARS    _______________________________ eSignedDanie Binder, MD 2015/03/18 10:12 AM    CPT CODES: ICD CODES:  The ICD and CPT codes recommended by this software are interpretations from the data that the clinical staff has captured with the software.  The verification of the translation of this report to the ICD and CPT codes and modifiers is the sole responsibility of the health care institution and practicing physician where this report was generated.  [employer] will not be held responsible for the validity of the ICD and CPT codes included on this report.  AMA assumes no liability for data contained or not contained herein. CPT is a Designer, television/film set of the [employer].

## 2015-03-12 ENCOUNTER — Encounter (HOSPITAL_COMMUNITY): Payer: Self-pay | Admitting: Gastroenterology

## 2015-03-12 ENCOUNTER — Telehealth: Payer: Self-pay | Admitting: Gastroenterology

## 2015-03-12 NOTE — Telephone Encounter (Signed)
Please call pt. She had FOUR simple adenomas removed.  FOLLOW A High fiber diet.   Next TCS in 3 years. YOUR SISTERS, BROTHERS, CHILDREN, AND PARENTS NEED TO HAVE A COLONOSCOPY STARTING AT THE AGE OF 40.

## 2015-03-12 NOTE — Telephone Encounter (Signed)
I called and informed pt. She said she will have her daughter call me so she can better understand.

## 2015-03-12 NOTE — Telephone Encounter (Signed)
Reminder in epic °

## 2016-10-10 ENCOUNTER — Encounter: Payer: Self-pay | Admitting: Physician Assistant

## 2016-10-10 ENCOUNTER — Other Ambulatory Visit: Payer: Self-pay

## 2016-10-10 ENCOUNTER — Ambulatory Visit (INDEPENDENT_AMBULATORY_CARE_PROVIDER_SITE_OTHER): Payer: BLUE CROSS/BLUE SHIELD | Admitting: Physician Assistant

## 2016-10-10 VITALS — BP 102/80 | HR 88 | Temp 97.9°F | Resp 16 | Ht 60.0 in | Wt 176.8 lb

## 2016-10-10 DIAGNOSIS — Z23 Encounter for immunization: Secondary | ICD-10-CM | POA: Diagnosis not present

## 2016-10-10 DIAGNOSIS — K760 Fatty (change of) liver, not elsewhere classified: Secondary | ICD-10-CM | POA: Diagnosis not present

## 2016-10-10 DIAGNOSIS — R7989 Other specified abnormal findings of blood chemistry: Secondary | ICD-10-CM

## 2016-10-10 DIAGNOSIS — R945 Abnormal results of liver function studies: Secondary | ICD-10-CM

## 2016-10-10 DIAGNOSIS — Z0000 Encounter for general adult medical examination without abnormal findings: Secondary | ICD-10-CM

## 2016-10-10 DIAGNOSIS — R5383 Other fatigue: Secondary | ICD-10-CM

## 2016-10-10 DIAGNOSIS — E559 Vitamin D deficiency, unspecified: Secondary | ICD-10-CM

## 2016-10-10 LAB — COMPLETE METABOLIC PANEL WITH GFR
ALBUMIN: 4.2 g/dL (ref 3.6–5.1)
ALT: 45 U/L — ABNORMAL HIGH (ref 6–29)
AST: 46 U/L — ABNORMAL HIGH (ref 10–35)
Alkaline Phosphatase: 134 U/L — ABNORMAL HIGH (ref 33–130)
BILIRUBIN TOTAL: 0.6 mg/dL (ref 0.2–1.2)
BUN: 13 mg/dL (ref 7–25)
CO2: 25 mmol/L (ref 20–32)
Calcium: 9.4 mg/dL (ref 8.6–10.4)
Chloride: 106 mmol/L (ref 98–110)
Creat: 0.58 mg/dL (ref 0.50–1.05)
GFR, Est African American: >89 mL/min (ref >=60)
GLUCOSE: 90 mg/dL (ref 70–99)
Potassium: 4.3 mmol/L (ref 3.5–5.3)
Sodium: 140 mmol/L (ref 135–146)
TOTAL PROTEIN: 7.1 g/dL (ref 6.1–8.1)

## 2016-10-10 LAB — CBC WITH DIFFERENTIAL/PLATELET
BASOS ABS: 0 {cells}/uL (ref 0–200)
Basophils Relative: 0 %
EOS ABS: 108 {cells}/uL (ref 15–500)
Eosinophils Relative: 2 %
HEMATOCRIT: 43.1 % (ref 35.0–45.0)
HEMOGLOBIN: 14.4 g/dL (ref 12.0–15.0)
LYMPHS ABS: 1674 {cells}/uL (ref 850–3900)
Lymphocytes Relative: 31 %
MCH: 30.7 pg (ref 27.0–33.0)
MCHC: 33.4 g/dL (ref 32.0–36.0)
MCV: 91.9 fL (ref 80.0–100.0)
MONO ABS: 378 {cells}/uL (ref 200–950)
MPV: 9.3 fL (ref 7.5–12.5)
Monocytes Relative: 7 %
NEUTROS ABS: 3240 {cells}/uL (ref 1500–7800)
NEUTROS PCT: 60 %
Platelets: 264 10*3/uL (ref 140–400)
RBC: 4.69 MIL/uL (ref 3.80–5.10)
RDW: 14.3 % (ref 11.0–15.0)
WBC: 5.4 10*3/uL (ref 3.8–10.8)

## 2016-10-10 LAB — LIPID PANEL
Cholesterol: 218 mg/dL — ABNORMAL HIGH (ref <200)
HDL: 86 mg/dL (ref >50)
LDL CALC: 108 mg/dL — AB (ref <100)
Total CHOL/HDL Ratio: 2.5 Ratio (ref <5.0)
Triglycerides: 119 mg/dL (ref <150)
VLDL: 24 mg/dL (ref <30)

## 2016-10-10 LAB — TSH: TSH: 1.59 mIU/L

## 2016-10-10 NOTE — Addendum Note (Signed)
Addended by: Vonna Kotyk A on: 10/10/2016 09:57 AM   Modules accepted: Orders

## 2016-10-10 NOTE — Progress Notes (Addendum)
Patient ID: Catherine Bass MRN: [id number], DOB: 29-Jun-1960, 56 y.o. Date of Encounter: 10/10/2016,   Chief Complaint: Physical (CPE)  HPI: 56 y.o. y/o [name] here for CPE.   08/06/2014:  here for CPE.   Spanish Interpreter (through [facility]) is present throughout the visit.  Patient has no specific complaints or concerns today. She saw me for an office visit for bacterial respiratory infection as a new patient to our office on 07/14/14. At that time she reported that she had never had a primary care provider and had "always gone to the ER  "when she would need any medical treatment. She states that her respiratory symptoms that she was having at that prior visit have resolved.    10/10/2016: Agan today [facility] interpreter is present throughout visit. Patient presents for another complete physical exam. The only concern that she wanted to address today is that she has recently been feeling more tired and sleepy. Asked about insomnia and asked whether she has difficulty falling asleep or whether she wakes up during the night --- initially they responded no.  As well she states that she is sleeping about 7 hours per night. However patient then said that she does wake up 2 or 3 times per night to use the bathroom. Noted that she does drink a lot of water.  She has no other specific concerns to address today. She is fasting.    Review of Systems: Consitutional: No fever, chills, fatigue, night sweats, lymphadenopathy. No significant/unexplained weight changes. Eyes: No visual changes, eye redness, or discharge. ENT/Mouth: No ear pain, sore throat, nasal drainage, or sinus pain. Cardiovascular: No chest pressure,heaviness, tightness or squeezing, even with exertion. No increased shortness of breath or dyspnea on exertion.No palpitations, edema, orthopnea, PND. Respiratory: No cough, hemoptysis, SOB, or wheezing. Gastrointestinal: No anorexia, dysphagia, reflux, pain, nausea,  vomiting, hematemesis, diarrhea, constipation, BRBPR, or melena. Breast: No mass, nodules, bulging, or retraction. No skin changes or inflammation. No nipple discharge. No lymphadenopathy. Genitourinary: No dysuria, hematuria, incontinence, vaginal discharge, pruritis, burning, abnormal bleeding, or pain. Musculoskeletal: No decreased ROM, No joint pain or swelling. No significant pain in neck, back, or extremities. Skin: No rash, pruritis, or concerning lesions. Neurological: No headache, dizziness, syncope, seizures, tremors, memory loss, coordination problems, or paresthesias. Psychological: No anxiety, depression, hallucinations, SI/HI. Endocrine: No polydipsia, polyphagia, polyuria, or known diabetes.No increased fatigue. No palpitations/rapid heart rate. No significant/unexplained weight change. All other systems were reviewed and are otherwise negative.  Past Medical History:  Diagnosis Date  . Hyperlipidemia      Past Surgical History:  Procedure Laterality Date  . ABDOMINAL HYSTERECTOMY    . CESAREAN SECTION     x2  . CHOLECYSTECTOMY    . COLONOSCOPY N/A 03/06/2015   Procedure: COLONOSCOPY;  Surgeon: Danie Binder, MD;  Location: [facility];  Service: Endoscopy;  Laterality: N/A;  1015 - pt knows to arrive at 7:15  . ESOPHAGOGASTRODUODENOSCOPY  12/20/2011   LYY:[id number] gastritis/doudenal mucosa normal    Home Meds:  Outpatient Medications Prior to Visit  Medication Sig Dispense Refill  . Na Sulfate-K Sulfate-Mg Sulf (SUPREP BOWEL PREP) SOLN Take 1 kit by mouth as directed. 1 Bottle 0   No facility-administered medications prior to visit.     Allergies: No Known Allergies  Social History   Social History  . Marital status: Married    Spouse name: N/A  . Number of children: N/A  . Years of education: N/A   Occupational History  .  Not on file.   Social History Main Topics  . Smoking status: Never Smoker  . Smokeless tobacco: Never Used  . Alcohol use No    . Drug use: No  . Sexual activity: Not on file   Other Topics Concern  . Not on file   Social History Narrative  . No narrative on file    Family History  Problem Relation Age of Onset  . Liver cancer Mother        ?states exposed to someone with hepatitis, ?cirrhosis  . Colon cancer Neg Hx   . Cancer Father 50       Lung Cancer    Physical Exam: Blood pressure 102/80, pulse 88, temperature 97.9 F (36.6 C), temperature source Oral, resp. rate 16, height 5' (1.524 m), weight 176 lb 12.8 oz (80.2 kg), SpO2 98 %., Body mass index is 34.53 kg/m. General: Well developed, well nourished, [name]. Appears in no acute distress. HEENT: Normocephalic, atraumatic. Conjunctiva pink, sclera non-icteric. Pupils 2 mm constricting to 1 mm, round, regular, and equally reactive to light and accomodation. EOMI. Internal auditory canal clear. TMs with good cone of light and without pathology. Nasal mucosa pink. Nares are without discharge. No sinus tenderness. Oral mucosa pink.  Pharynx without exudate. Multiple teeth with fillings.  Neck: Supple. Trachea midline. No thyromegaly. Full ROM. No lymphadenopathy.No Carotid Bruits. Lungs: Clear to auscultation bilaterally without wheezes, rales, or rhonchi. Breathing is of normal effort and unlabored. Cardiovascular: RRR with S1 S2. No murmurs, rubs, or gallops. Distal pulses 2+ symmetrically. No carotid or abdominal bruits. Breast: Symmetrical. No masses. Nipples without discharge. Abdomen: Soft, non-tender, non-distended with normoactive bowel sounds. No hepatosplenomegaly or masses. No rebound/guarding. No CVA tenderness. No hernias.  Genitourinary:  External genitalia without lesions. Vaginal mucosa pink.No discharge present.  No adnexal mass or tenderness.  Exam c/w hysterectomy. She does have bladder prolapse/cystocele.  Musculoskeletal: Full range of motion and 5/5 strength throughout.  Skin: Warm and moist without erythema, ecchymosis,  wounds, or rash. Neuro: A+Ox3. CN II-XII grossly intact. Moves all extremities spontaneously. Full sensation throughout. Normal gait.  Psych:  Responds to questions appropriately with a normal affect.   Assessment/Plan:  56 y.o. y/o female here for CPE  1. Visit for preventive health examination  A. Screening Labs: She is fasting. - CBC with Differential/Platelet - COMPLETE METABOLIC PANEL WITH GFR - Lipid panel - TSH - Vit D  25 hydroxy (rtn osteoporosis monitoring)  B. Pap: She has had hysterectomy that was NOT performed secondary to cancer. Therefore, does not need any further Pap smear. Pelvic exam today is normal except she does have cystocele. Discussed this finding with her. She states that this is asymptomatic and does not want to pursue any treatment for this.   C. Screening Mammogram: She reports that she has had no mammogram in many years. Agreeable for [place] to schedule mammogram. - MM Digital Screening; Future --- Reviewed with patient that at her CPE with me on 08/06/14 I ordered a mammogram but she did not go for this. Discussed risk of cancer and benefits of procedure and discussed why she did not go for past mammogram and whether she plans to go for mammogram now. She indicates that she does understand risk versus benefit of having the mammogram and states that she will go for this mammogram.  D. DEXA/BMD:  She has had hysterectomy but no oophorectomy. She is only age 55. She does not smoke. We can wait to do bone density  scan in the future.  E. Colorectal Cancer Screening: She had colonoscopy by Dr. Oneida Alar 03/06/2015. 4 polyps removed. Recommendations: await biopsy. Next colonoscopy in 3-5 years.  F. Immunizations:  Influenza:N/A--it is August Tetanus:  She is agreeable to update this now. T dap given here 10/10/2016 Pneumococcal: She has no indication to require pneumonia vaccine until age 25. Shingrix: This is currently on manufacturer backorder. She can go ahead  and be checking regarding insurance coverage and then we will follow-up giving this once we have supply.   2. Elevated LFTs At OV 2016 LFTs were elevated. Subsequent hepatitis panel negative. She had liver ultrasound 09/05/2014 which showed findings consistent with fatty infiltration. No other significant abnormalities/findings.  3. Hyperlipidemia At OV 2016: "She reports that she has been told she had high cholesterol in the past. Says that it had been treated with diet changes and that she has never taken medication for this." - COMPLETE METABOLIC PANEL WITH GFR - Lipid panel  4. Bladder Prolapse/Cystocele- See note next to Pap smear note above.   5. She has not had HIV screen so will check this. - HIV antibody   6. Vitamin D deficiency At lab in 2016 vitamin D was low and we prescribed Rx strength vitamin D. Recheck vitamin D level today. - VITAMIN D 25 Hydroxy (Vit-D Deficiency, Fractures)  7. Fatigue, unspecified type I told her to stop drinking several hours prior to going to bed. This should help with her nocturia. Wll check labs to evaluate for other causes of fatigue. Also age is contributing to fatigue. - CBC with Differential/Platelet - COMPLETE METABOLIC PANEL WITH GFR - TSH - VITAMIN D 25 Hydroxy (Vit-D Deficiency, Fractures)    Signed, Karis Juba, [place], [facility] 10/10/2016 9:34 AM

## 2016-10-11 LAB — HIV ANTIBODY (ROUTINE TESTING W REFLEX): HIV 1&2 Ab, 4th Generation: NONREACTIVE

## 2016-10-11 LAB — VITAMIN D 25 HYDROXY (VIT D DEFICIENCY, FRACTURES): Vit D, 25-Hydroxy: 14 ng/mL — ABNORMAL LOW (ref 30–100)

## 2016-10-14 ENCOUNTER — Other Ambulatory Visit: Payer: Self-pay

## 2016-10-14 MED ORDER — VITAMIN D (ERGOCALCIFEROL) 1.25 MG (50000 UNIT) PO CAPS: 50000.0000 [IU] | ORAL_CAPSULE | ORAL | 0 refills | Status: AC

## 2016-10-14 MED ORDER — CHOLECALCIFEROL 100 MCG (4000 UT) PO CAPS: 1.0000 | ORAL_CAPSULE | Freq: Every day | ORAL | 3 refills | Status: AC

## 2016-11-11 ENCOUNTER — Encounter: Payer: Self-pay | Admitting: Family Medicine

## 2016-11-21 ENCOUNTER — Ambulatory Visit (HOSPITAL_COMMUNITY): Payer: BLUE CROSS/BLUE SHIELD

## 2017-05-15 IMAGING — US US ABDOMEN LIMITED
1 series · 14 of 25 positions shown · non-contrast
Comparison: 12/15/2011

CLINICAL DATA: Elevated LFTs

EXAM:
US ABDOMEN LIMITED - RIGHT UPPER QUADRANT

[Series 1: us abdomen limited · 0.16mm/px · 14 of 39 slices shown]
[im 1/39]
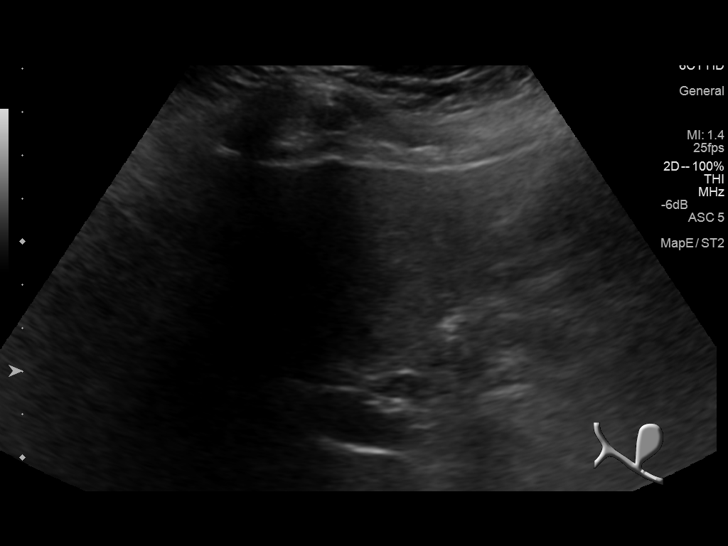
[im 4/39]
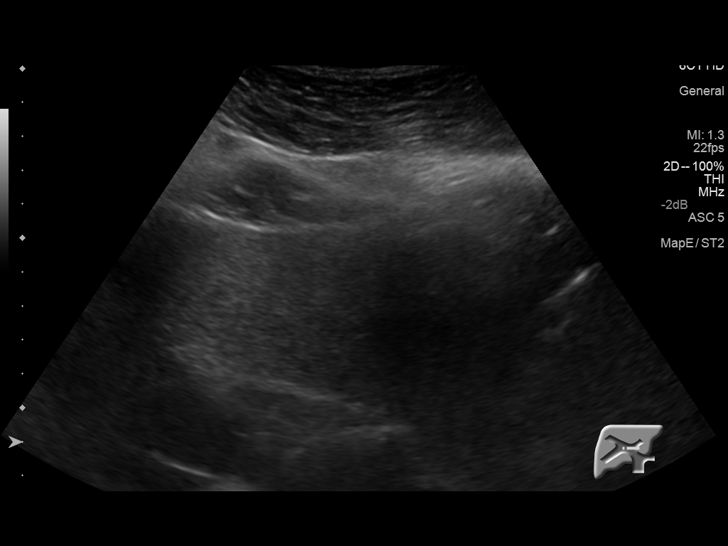
[im 7/39]
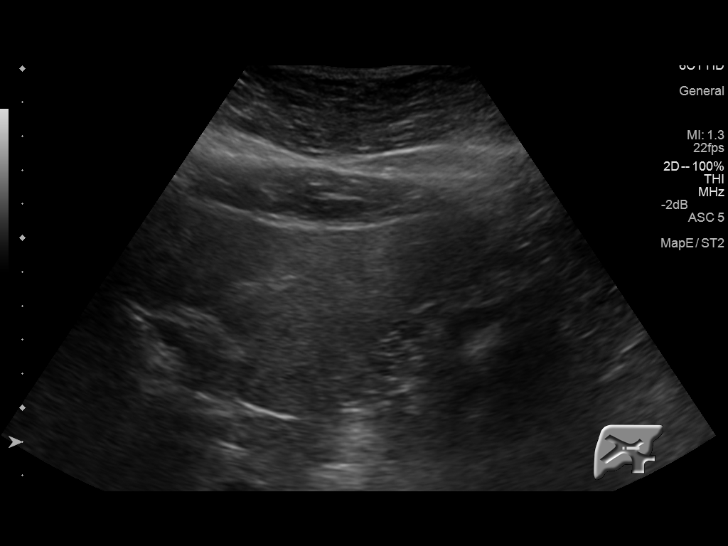
[im 10/39]
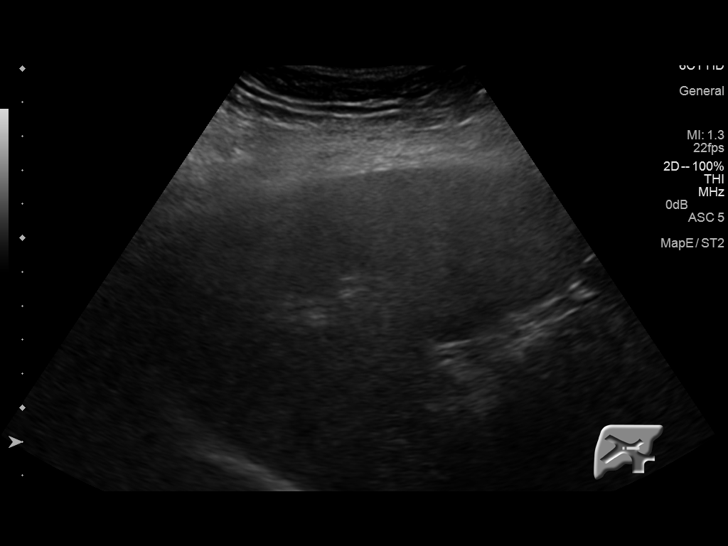
[im 13/39]
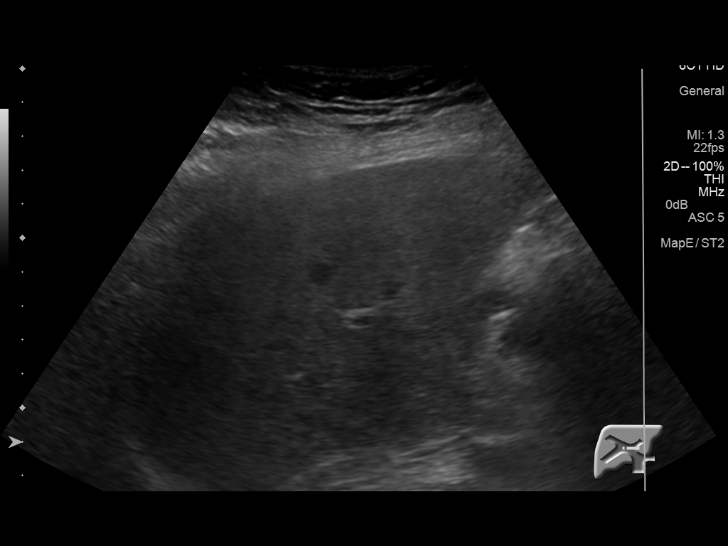
[im 15/39]
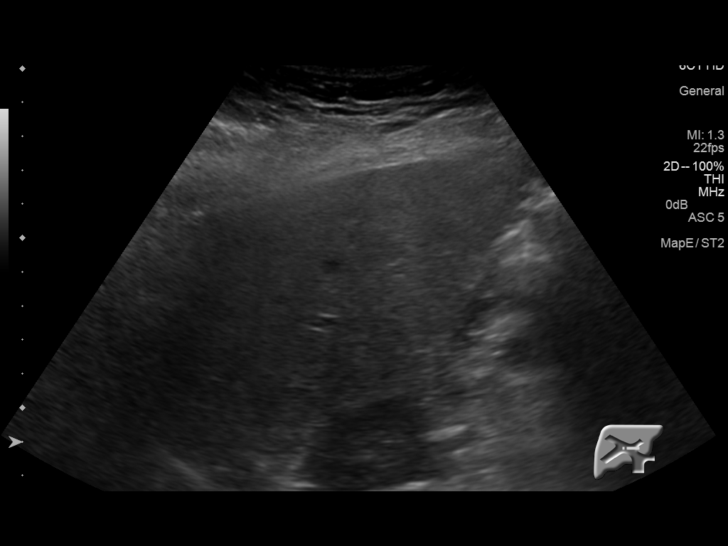
[im 18/39]
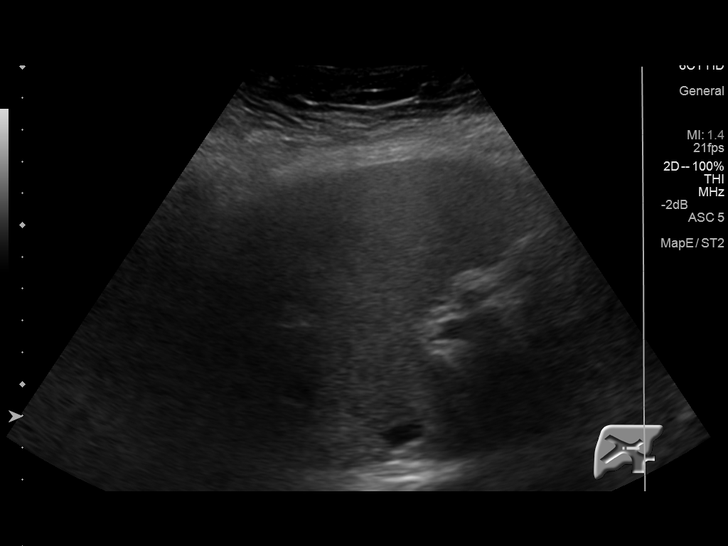
[im 21/39]
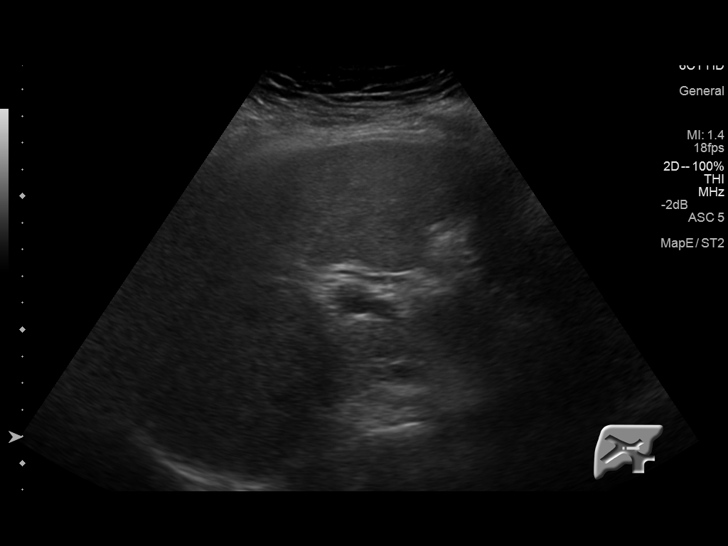
[im 24/39]
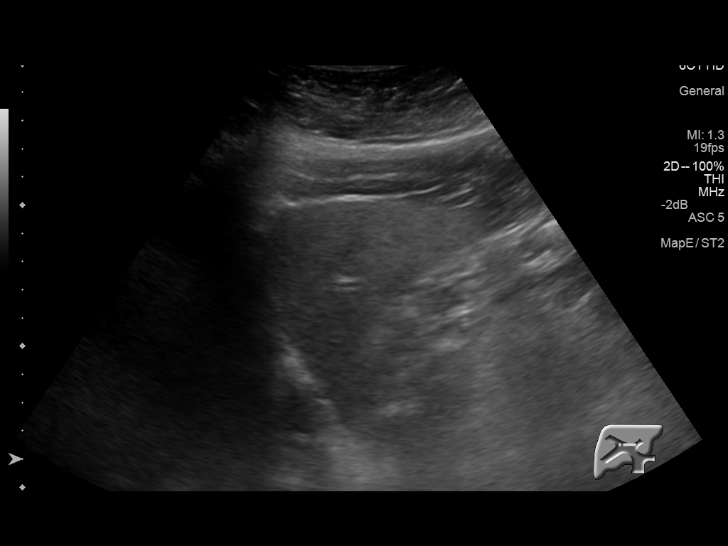
[im 26/39]
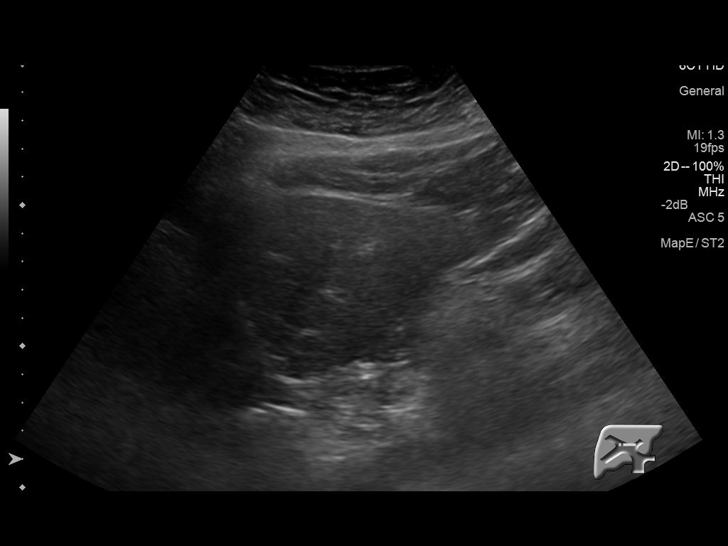
[im 29/39]
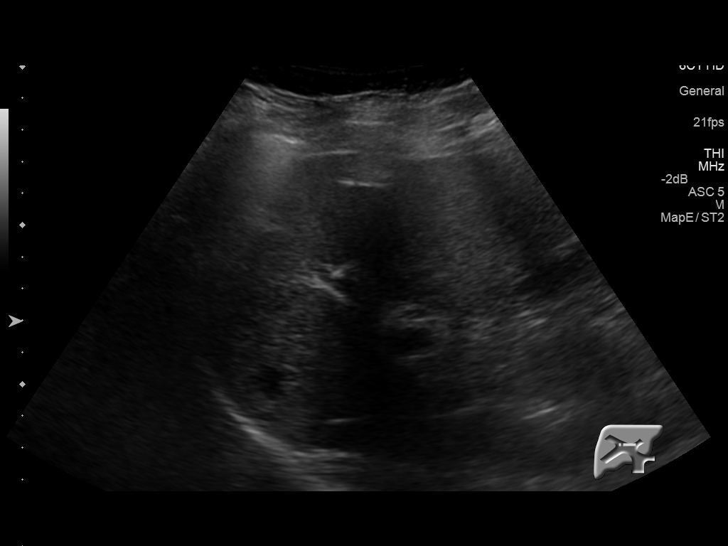
[im 32/39]
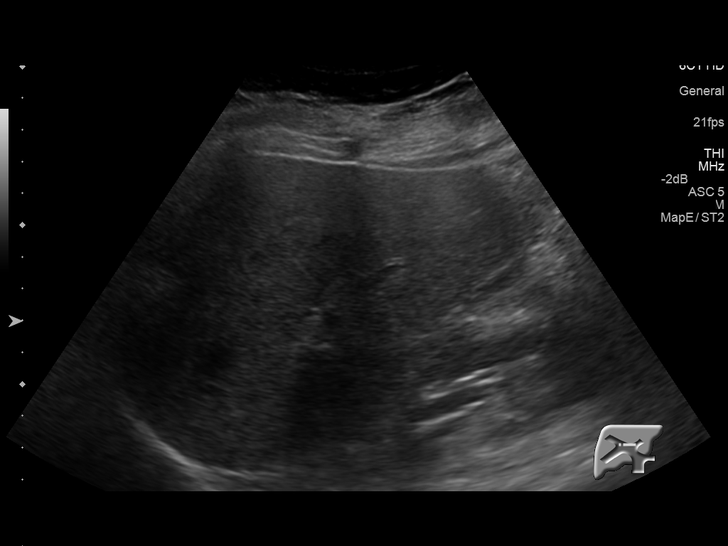
[im 35/39]
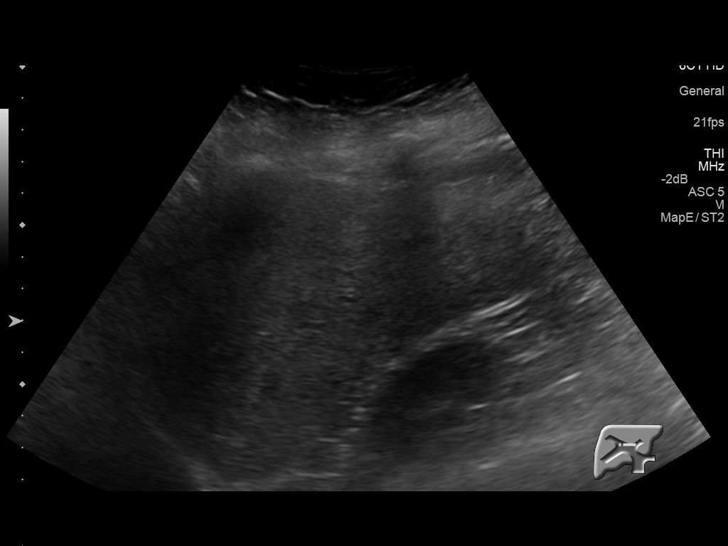
[im 39/39]
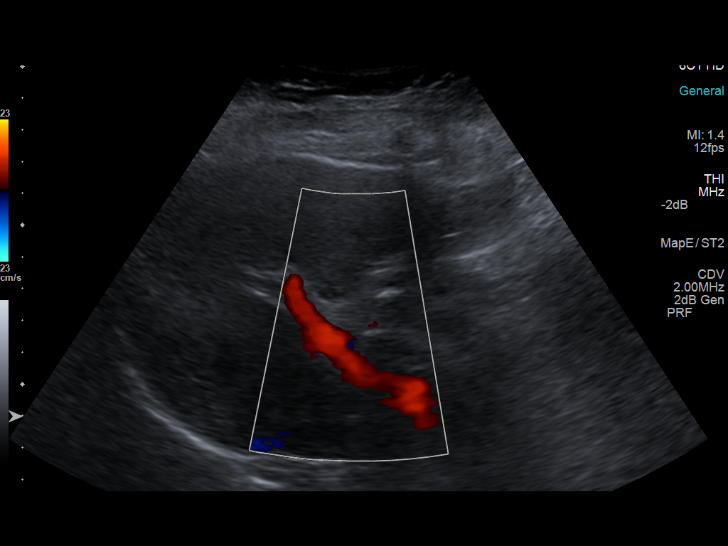

[14 of 25 positions shown; findings below may reference images not displayed]

FINDINGS: Gallbladder:

Surgically absent

Common bile duct:

Diameter: 6.3 mm in diameter

Liver:

No focal hepatic mass. There is diffuse increased echogenicity of
the liver consistent with fatty infiltration. No intrahepatic
biliary ductal dilatation.
IMPRESSION: 1. Surgically absent gallbladder. Normal CBD. Diffuse increased
echogenicity of the liver suspicious for fatty infiltration. No
focal hepatic mass.

## 2017-10-12 ENCOUNTER — Encounter: Payer: BLUE CROSS/BLUE SHIELD | Admitting: Physician Assistant

## 2017-10-16 ENCOUNTER — Encounter: Payer: Self-pay | Admitting: Physician Assistant

## 2018-02-13 ENCOUNTER — Encounter: Payer: Self-pay | Admitting: Gastroenterology

## 2021-06-29 ENCOUNTER — Other Ambulatory Visit: Payer: Self-pay | Admitting: *Deleted

## 2021-06-29 DIAGNOSIS — Z1231 Encounter for screening mammogram for malignant neoplasm of breast: Secondary | ICD-10-CM

## 2021-09-08 ENCOUNTER — Ambulatory Visit (HOSPITAL_COMMUNITY)
Admission: RE | Admit: 2021-09-08 | Discharge: 2021-09-08 | Disposition: A | Discharge status: Home / Self Care | Payer: BLUE CROSS/BLUE SHIELD | Source: Ambulatory Visit | Attending: *Deleted | Admitting: *Deleted

## 2021-09-08 DIAGNOSIS — Z1231 Encounter for screening mammogram for malignant neoplasm of breast: Secondary | ICD-10-CM | POA: Insufficient documentation
